# Patient Record
Sex: Female | Born: 1960 | Race: Black or African American | Hispanic: No | Marital: Single | Smoking: Current every day smoker
Health system: Southern US, Community
[De-identification: ages and names within clinical notes are randomized; demographics above are authoritative.]

---

## 2018-10-22 ENCOUNTER — Emergency Department
Admission: EM | Admit: 2018-10-22 | Discharge: 2018-10-22 | Disposition: A | Discharge status: Home / Self Care | Payer: Self-pay | Attending: Emergency Medicine | Admitting: Emergency Medicine

## 2018-10-22 ENCOUNTER — Other Ambulatory Visit: Payer: Self-pay

## 2018-10-22 ENCOUNTER — Encounter: Payer: Self-pay | Admitting: Emergency Medicine

## 2018-10-22 ENCOUNTER — Emergency Department: Payer: Self-pay

## 2018-10-22 DIAGNOSIS — F17210 Nicotine dependence, cigarettes, uncomplicated: Secondary | ICD-10-CM | POA: Insufficient documentation

## 2018-10-22 DIAGNOSIS — D179 Benign lipomatous neoplasm, unspecified: Secondary | ICD-10-CM | POA: Insufficient documentation

## 2018-10-22 DIAGNOSIS — M25532 Pain in left wrist: Secondary | ICD-10-CM | POA: Insufficient documentation

## 2018-10-22 MED ORDER — NAPROXEN 500 MG PO TABS: 500.0000 mg | ORAL_TABLET | Freq: Two times a day (BID) | ORAL | Status: DC

## 2018-10-22 NOTE — Discharge Instructions (Signed)
Follow discharge care instruction.  Wear splint for 3 to 4 days as needed.  Take medication as directed.

## 2018-10-22 NOTE — ED Triage Notes (Signed)
Pt here with c/o left hand pain, states she was in grocery store, saw a lady falling and reached out to help her, in the process, pt fell to floor, catching herself with left hand, no swelling or deformity noted, able to move hand and fingers.

## 2018-10-22 NOTE — ED Provider Notes (Signed)
[facility] Emergency Department Provider Note   ____________________________________________   First MD Initiated Contact with Patient 10/22/18 1006     (approximate)  I have reviewed the triage vital signs and the nursing notes.   HISTORY  Chief Complaint Hand Pain    HPI Carrie Jones is a 58 y.o. female patient complain of left hand and wrist pain for 2 days.  Patient states she was in a grocery store and saw a lady followed every child to help her.  Patient states she fell to the floor breaking her fall with her left outstretched hand.  Patient denies deformity or obvious swelling.  Patient states pain increased with hyperextension of the wrist.    History reviewed. No pertinent past medical history.  There are no active problems to display for this patient.   History reviewed. No pertinent surgical history.  Prior to Admission medications   Medication Sig Start Date End Date Taking? Authorizing Provider  naproxen (NAPROSYN) 500 MG tablet Take 1 tablet (500 mg total) by mouth 2 (two) times daily with a meal. 10/22/18   Sable Feil, PA-C    Allergies Patient has no known allergies.  No family history on file.  Social History Social History   Tobacco Use  . Smoking status: Current Every Day Smoker    Packs/day: 0.50  . Smokeless tobacco: Never Used  Substance Use Topics  . Alcohol use: Yes    Comment: occas.   . Drug use: Not on file    Review of Systems Constitutional: No fever/chills Eyes: No visual changes. ENT: No sore throat. Cardiovascular: Denies chest pain. Respiratory: Denies shortness of breath. Gastrointestinal: No abdominal pain.  No nausea, no vomiting.  No diarrhea.  No constipation. Genitourinary: Negative for dysuria. Musculoskeletal: Left wrist pain. Skin: Negative for rash.  Nodule lesion left wrist and right anterior shoulder. Neurological: Negative for headaches, focal weakness or  numbness.   ____________________________________________   PHYSICAL EXAM:  VITAL SIGNS: ED Triage Vitals [10/22/18 0938]  Enc Vitals Group     BP 132/78     Pulse Rate 76     Resp 18     Temp 98.3 F (36.8 C)     Temp Source Oral     SpO2 98 %     Weight 122 lb (55.3 kg)     Height 5' (1.524 m)     Head Circumference      Peak Flow      Pain Score 8     Pain Loc      Pain Edu?      Excl. in [place]?     Constitutional: Alert and oriented. Well appearing and in no acute distress. Cardiovascular: Normal rate, regular rhythm. Grossly normal heart sounds.  Good peripheral circulation. Respiratory: Normal respiratory effort.  No retractions. Lungs CTAB. Musculoskeletal: No obvious deformity to the left wrist.  Patient has decreased range of motion with extension limited by complaint of pain.  Neurologic:  Normal speech and language. No gross focal neurologic deficits are appreciated. No gait instability. Skin:  Skin is warm, dry and intact. No rash noted.  Nodule lesion dorsal aspect of left wrist and anterior right shoulder. Psychiatric: Mood and affect are normal. Speech and behavior are normal.  ____________________________________________   LABS (all labs ordered are listed, but only abnormal results are displayed)  Labs Reviewed - No data to display ____________________________________________  EKG   ____________________________________________  RADIOLOGY  ED MD interpretation:    Official  radiology report(s): Dg Wrist Complete Left  Result Date: 10/22/2018 CLINICAL DATA:  Left wrist pain centered about the thumb since the patient tried to catch another person a couple of days ago. Initial encounter. EXAM: LEFT WRIST - COMPLETE 3+ VIEW COMPARISON:  None. FINDINGS: There is no acute bony or joint abnormality. There is some joint space narrowing and subchondral cyst formation at the first [facility] and scaphoid trapezium joints. No erosion. Mineralization and alignment are  normal. No chondrocalcinosis. Soft tissues appear normal. IMPRESSION: No acute abnormality. 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 Electronically Signed   By: Inge Rise M.D.   On: 10/22/2018 10:47    ____________________________________________   PROCEDURES  Procedure(s) performed: None  Procedures  Critical Care performed: No  ____________________________________________   INITIAL IMPRESSION / ASSESSMENT AND PLAN / ED COURSE  As part of my medical decision making, I reviewed the following data within the electronic MEDICAL RECORD NUMBER     Left wrist pain secondary to sprain and DJD.  Patient also have 2 nodular lesion consistent with lipoma or or a cystic mass.  Patient placed in a volar wrist splint and given naproxen prescription.  Patient advised to follow-up with the surgical clinic for evaluation treatment of the nodule lesions mentioned above.      ____________________________________________   FINAL CLINICAL IMPRESSION(S) / ED DIAGNOSES  Final diagnoses:  Left wrist pain  Multiple lipomas     ED Discharge Orders         Ordered    naproxen (NAPROSYN) 500 MG tablet  2 times daily with meals     10/22/18 1053           Note:  This document was prepared using Dragon voice recognition software and may include unintentional dictation errors.    Sable Feil, PA-C 10/22/18 1119    Earleen Newport, MD 10/22/18 [phone]

## 2018-10-22 NOTE — ED Notes (Signed)
See triage note  Presents with pain to left hand   Pain is mainly near thumb and into wrist area   States she did not fall but tried to catch someone couple of days ago

## 2018-11-04 ENCOUNTER — Encounter: Payer: Self-pay | Admitting: *Deleted

## 2019-12-18 IMAGING — DX DG WRIST COMPLETE 3+V*L*
4 series · 4 of 4 positions shown · non-contrast
Comparison: None.

CLINICAL DATA: Left wrist pain centered about the thumb since the
patient tried to catch another person a couple of days ago. Initial
encounter.

EXAM:
LEFT WRIST - COMPLETE 3+ VIEW

[wrist obl]
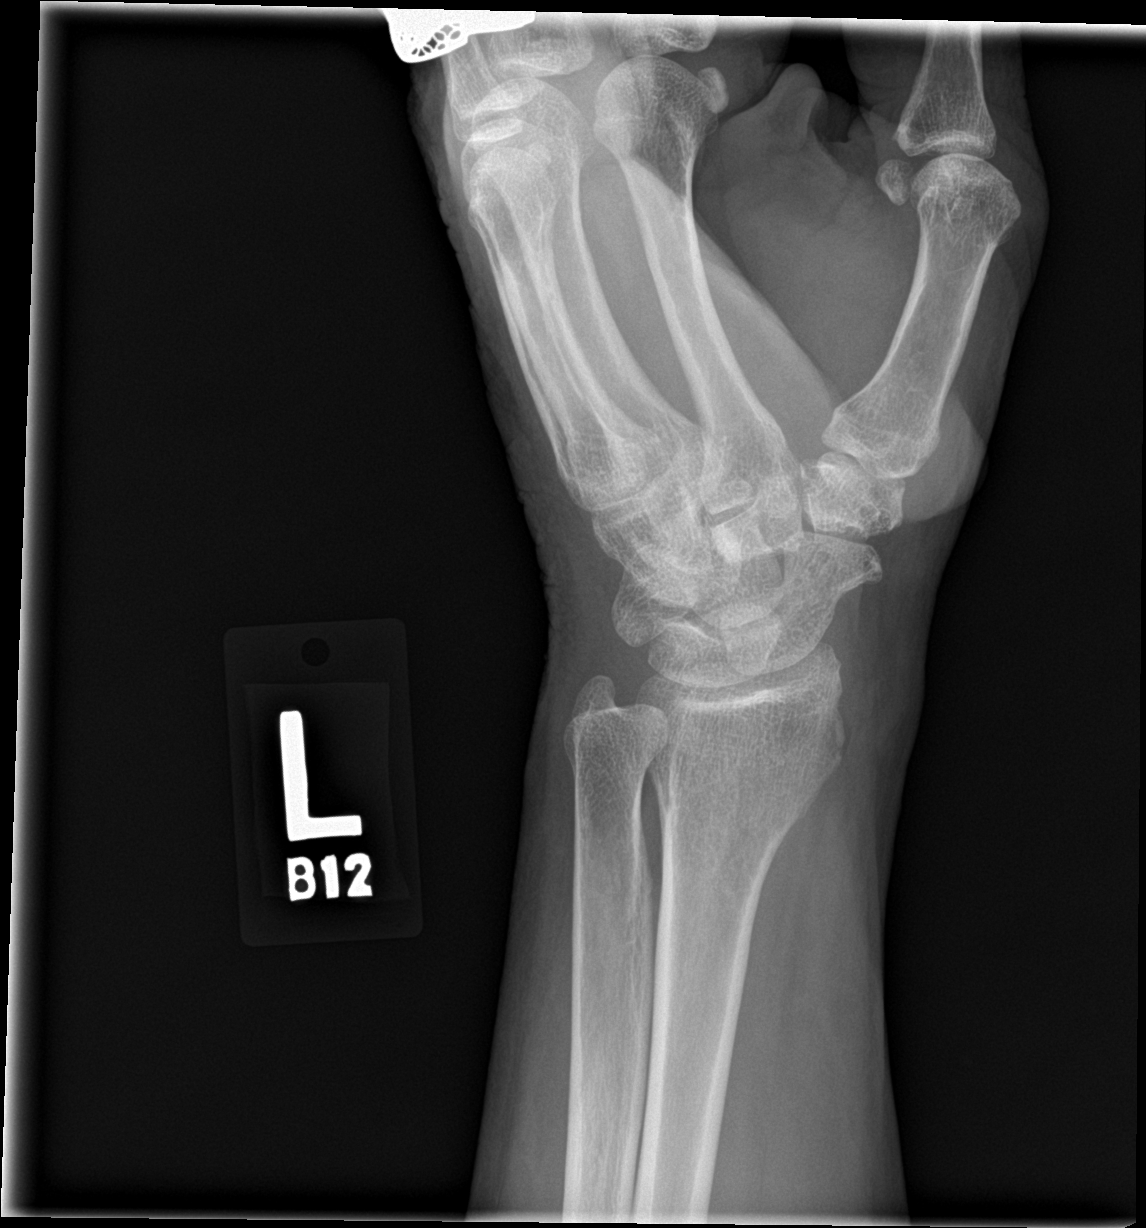

[wrist lat]
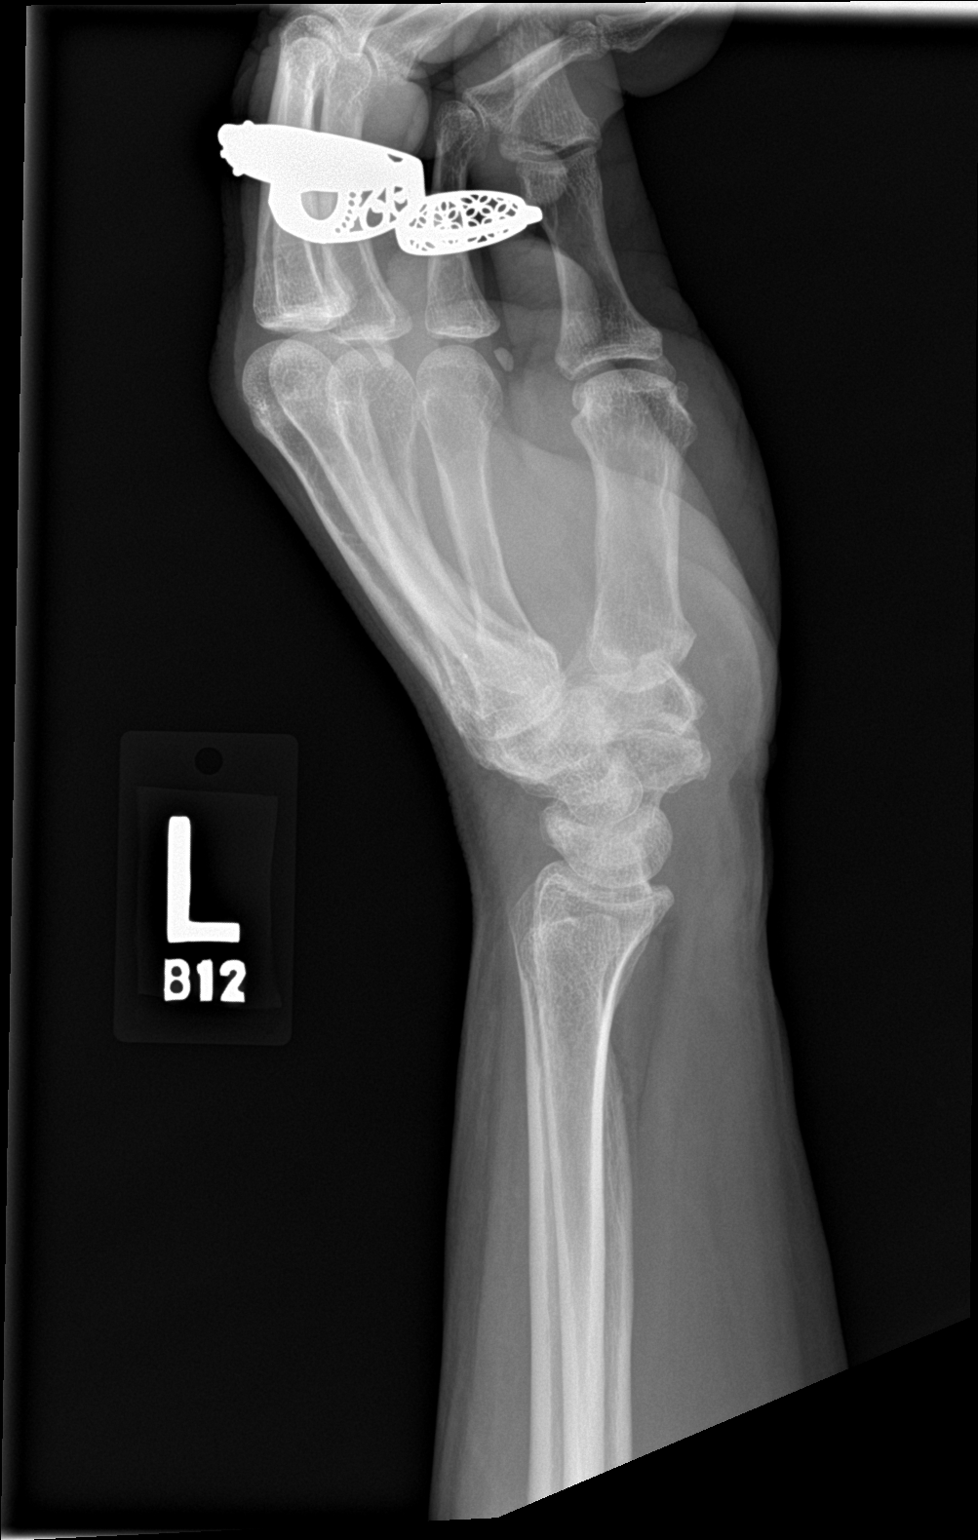

[wrist ap (1 of 2)]
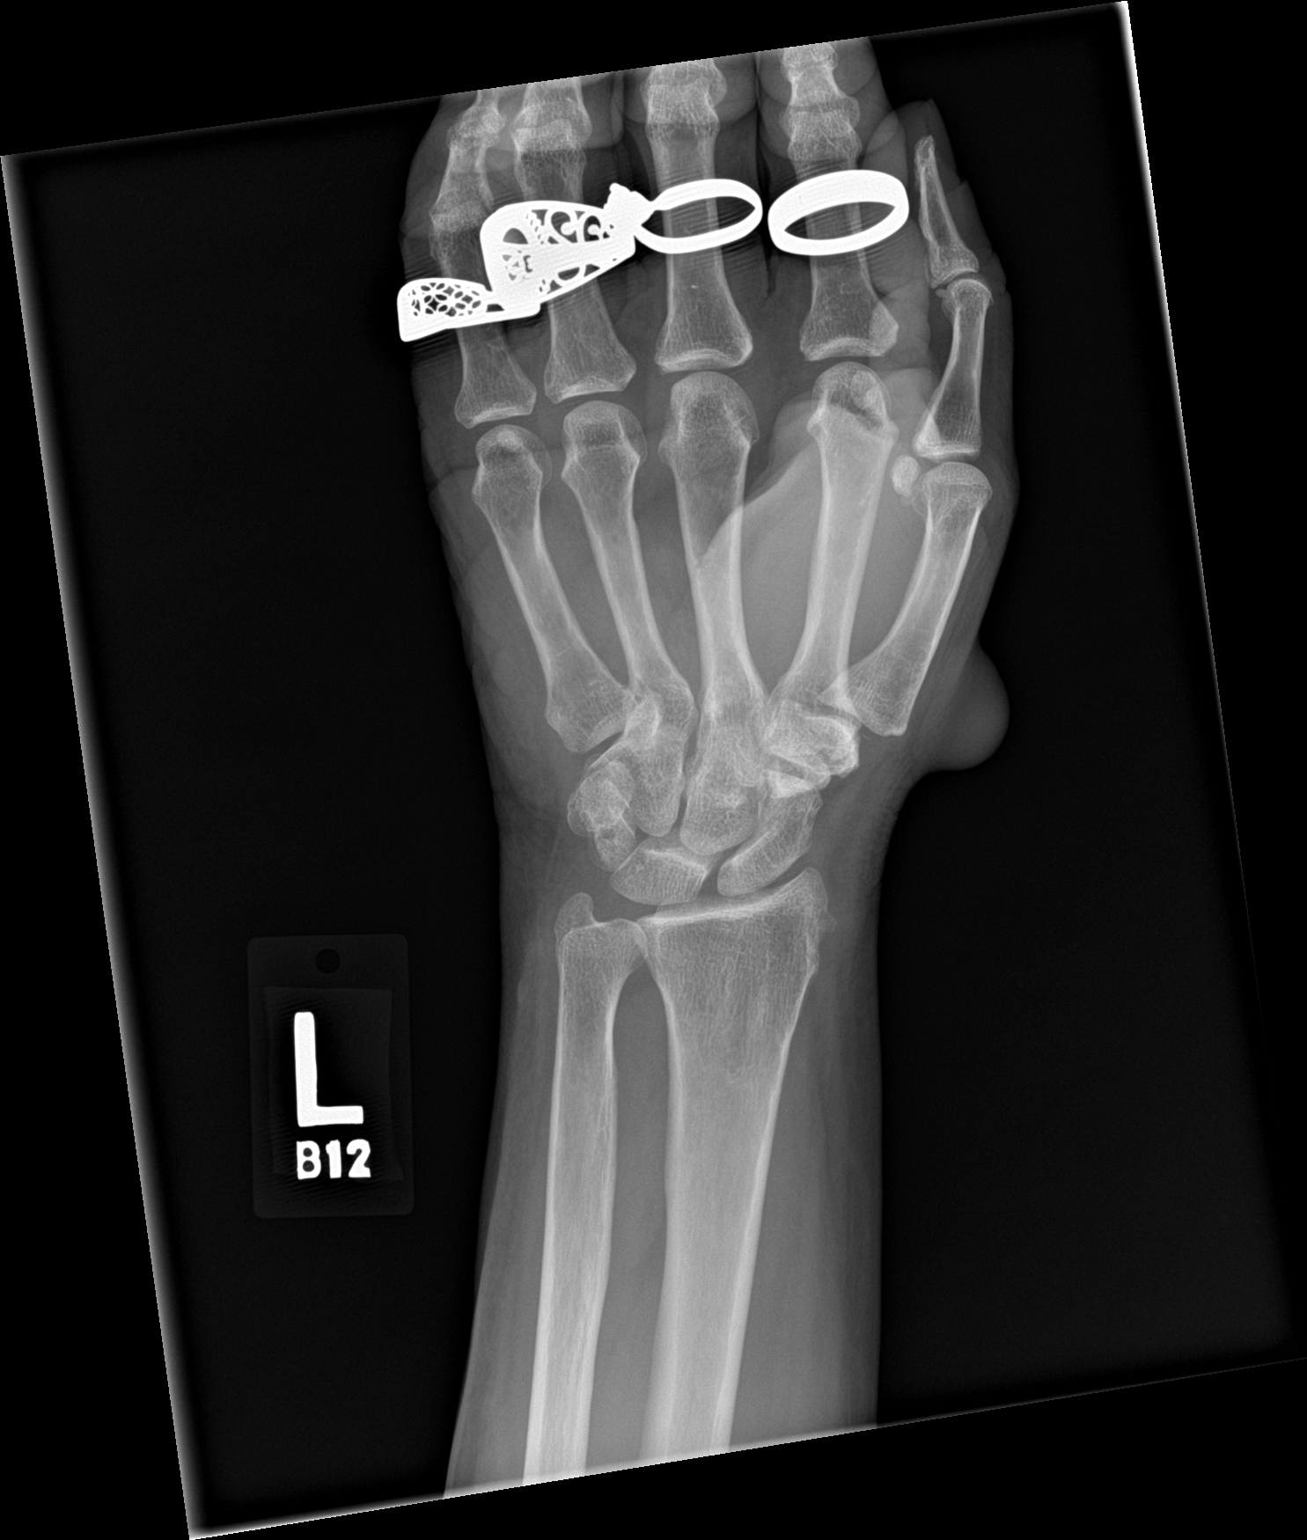

[wrist ap (2 of 2)]
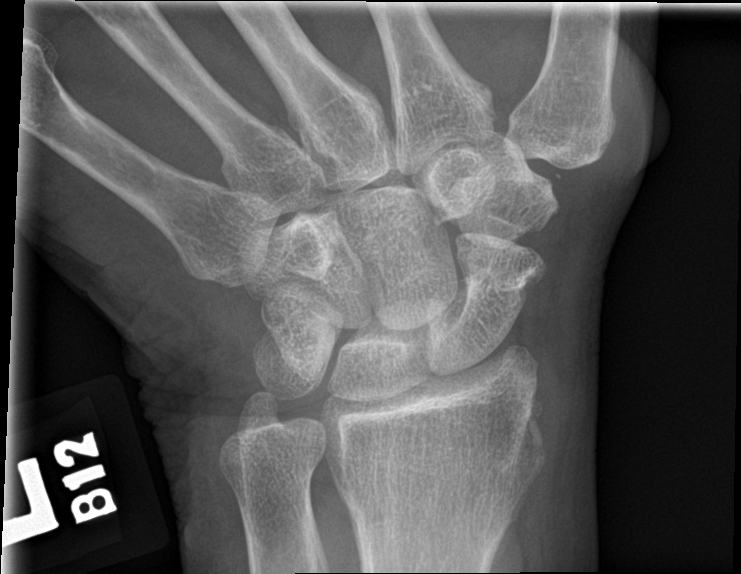

[4 of 4 positions shown; findings below may reference images not displayed]

FINDINGS: There is no acute bony or joint abnormality. There is some joint
space narrowing and subchondral cyst formation at the first CMC and
scaphoid trapezium joints. No erosion. Mineralization and alignment
are normal. No chondrocalcinosis. Soft tissues appear normal.
IMPRESSION: No acute abnormality.

Mild to moderate appearing first CMC and STT osteoarthritis.

## 2020-02-09 ENCOUNTER — Ambulatory Visit: Payer: Self-pay | Attending: Internal Medicine

## 2020-02-09 DIAGNOSIS — Z23 Encounter for immunization: Secondary | ICD-10-CM

## 2020-02-09 NOTE — Progress Notes (Signed)
Covid-19 Vaccination Clinic  Name:  Carrie Jones    MRN: [id number] DOB: 07/01/1961  02/09/2020  [name] was observed post Covid-19 immunization for 15 minutes without incident. She was provided with Vaccine Information Sheet and instruction to access the V-Safe system.   [name] was instructed to call 911 with any severe reactions post vaccine: Marland Kitchen Difficulty breathing  . Swelling of face and throat  . A fast heartbeat  . A bad rash all over body  . Dizziness and weakness   Immunizations Administered    Name Date Dose VIS Date Route   Pfizer COVID-19 Vaccine 02/09/2020  2:04 PM 0.3 mL 11/19/2018 Intramuscular   Manufacturer: [employer], [employer]   Lot: [id number]   NDC: [address]-4

## 2023-07-23 ENCOUNTER — Inpatient Hospital Stay
Admission: EM | Admit: 2023-07-23 | Discharge: 2023-07-26 | DRG: 597 | Disposition: A | Discharge status: Home / Self Care | Payer: 59 | Attending: Internal Medicine | Admitting: Internal Medicine

## 2023-07-23 ENCOUNTER — Emergency Department: Payer: Self-pay

## 2023-07-23 ENCOUNTER — Encounter: Payer: Self-pay | Admitting: Emergency Medicine

## 2023-07-23 ENCOUNTER — Other Ambulatory Visit: Payer: Self-pay

## 2023-07-23 DIAGNOSIS — Z801 Family history of malignant neoplasm of trachea, bronchus and lung: Secondary | ICD-10-CM

## 2023-07-23 DIAGNOSIS — I81 Portal vein thrombosis: Secondary | ICD-10-CM | POA: Diagnosis present

## 2023-07-23 DIAGNOSIS — C7951 Secondary malignant neoplasm of bone: Secondary | ICD-10-CM | POA: Diagnosis present

## 2023-07-23 DIAGNOSIS — N9089 Other specified noninflammatory disorders of vulva and perineum: Secondary | ICD-10-CM

## 2023-07-23 DIAGNOSIS — N179 Acute kidney failure, unspecified: Secondary | ICD-10-CM | POA: Diagnosis not present

## 2023-07-23 DIAGNOSIS — K7460 Unspecified cirrhosis of liver: Secondary | ICD-10-CM | POA: Diagnosis present

## 2023-07-23 DIAGNOSIS — D72828 Other elevated white blood cell count: Secondary | ICD-10-CM | POA: Diagnosis present

## 2023-07-23 DIAGNOSIS — C50912 Malignant neoplasm of unspecified site of left female breast: Principal | ICD-10-CM | POA: Diagnosis present

## 2023-07-23 DIAGNOSIS — M899 Disorder of bone, unspecified: Secondary | ICD-10-CM | POA: Diagnosis not present

## 2023-07-23 DIAGNOSIS — C799 Secondary malignant neoplasm of unspecified site: Principal | ICD-10-CM

## 2023-07-23 DIAGNOSIS — G8929 Other chronic pain: Secondary | ICD-10-CM | POA: Diagnosis present

## 2023-07-23 DIAGNOSIS — Z515 Encounter for palliative care: Secondary | ICD-10-CM | POA: Diagnosis not present

## 2023-07-23 DIAGNOSIS — C787 Secondary malignant neoplasm of liver and intrahepatic bile duct: Secondary | ICD-10-CM | POA: Diagnosis present

## 2023-07-23 DIAGNOSIS — Z66 Do not resuscitate: Secondary | ICD-10-CM | POA: Diagnosis present

## 2023-07-23 DIAGNOSIS — Z79899 Other long term (current) drug therapy: Secondary | ICD-10-CM | POA: Diagnosis not present

## 2023-07-23 DIAGNOSIS — Z7901 Long term (current) use of anticoagulants: Secondary | ICD-10-CM | POA: Diagnosis not present

## 2023-07-23 DIAGNOSIS — F1010 Alcohol abuse, uncomplicated: Secondary | ICD-10-CM | POA: Insufficient documentation

## 2023-07-23 DIAGNOSIS — Z789 Other specified health status: Secondary | ICD-10-CM | POA: Diagnosis not present

## 2023-07-23 DIAGNOSIS — Z720 Tobacco use: Secondary | ICD-10-CM | POA: Insufficient documentation

## 2023-07-23 DIAGNOSIS — F17210 Nicotine dependence, cigarettes, uncomplicated: Secondary | ICD-10-CM | POA: Diagnosis present

## 2023-07-23 DIAGNOSIS — A630 Anogenital (venereal) warts: Secondary | ICD-10-CM | POA: Diagnosis present

## 2023-07-23 DIAGNOSIS — R7401 Elevation of levels of liver transaminase levels: Secondary | ICD-10-CM

## 2023-07-23 DIAGNOSIS — Z803 Family history of malignant neoplasm of breast: Secondary | ICD-10-CM | POA: Diagnosis not present

## 2023-07-23 DIAGNOSIS — D630 Anemia in neoplastic disease: Secondary | ICD-10-CM | POA: Diagnosis present

## 2023-07-23 DIAGNOSIS — M549 Dorsalgia, unspecified: Secondary | ICD-10-CM | POA: Diagnosis present

## 2023-07-23 DIAGNOSIS — M546 Pain in thoracic spine: Secondary | ICD-10-CM

## 2023-07-23 DIAGNOSIS — C50919 Malignant neoplasm of unspecified site of unspecified female breast: Secondary | ICD-10-CM | POA: Diagnosis present

## 2023-07-23 LAB — CBC WITH DIFFERENTIAL/PLATELET
Abs Immature Granulocytes: 0.11 10*3/uL — ABNORMAL HIGH (ref 0.00–0.07)
Basophils Absolute: 0 10*3/uL (ref 0.0–0.1)
Basophils Relative: 0 %
Eosinophils Absolute: 0.1 10*3/uL (ref 0.0–0.5)
Eosinophils Relative: 1 %
HCT: 38.6 % (ref 36.0–46.0)
Hemoglobin: 12.4 g/dL (ref 12.0–15.0)
Immature Granulocytes: 1 %
Lymphocytes Relative: 13 %
Lymphs Abs: 1.7 10*3/uL (ref 0.7–4.0)
MCH: 29.2 pg (ref 26.0–34.0)
MCHC: 32.1 g/dL (ref 30.0–36.0)
MCV: 90.8 fL (ref 80.0–100.0)
Monocytes Absolute: 0.9 10*3/uL (ref 0.1–1.0)
Monocytes Relative: 7 %
Neutro Abs: 10.5 10*3/uL — ABNORMAL HIGH (ref 1.7–7.7)
Neutrophils Relative %: 78 %
Platelets: 157 10*3/uL (ref 150–400)
RBC: 4.25 MIL/uL (ref 3.87–5.11)
RDW: 18.7 % — ABNORMAL HIGH (ref 11.5–15.5)
WBC: 13.3 10*3/uL — ABNORMAL HIGH (ref 4.0–10.5)
nRBC: 2.7 % — ABNORMAL HIGH (ref 0.0–0.2)

## 2023-07-23 LAB — HIV ANTIBODY (ROUTINE TESTING W REFLEX): HIV Screen 4th Generation wRfx: NONREACTIVE

## 2023-07-23 LAB — COMPREHENSIVE METABOLIC PANEL
ALT: 57 U/L — ABNORMAL HIGH (ref 0–44)
AST: 300 U/L — ABNORMAL HIGH (ref 15–41)
Albumin: 2.6 g/dL — ABNORMAL LOW (ref 3.5–5.0)
Alkaline Phosphatase: 173 U/L — ABNORMAL HIGH (ref 38–126)
Anion gap: 13 (ref 5–15)
BUN: 17 mg/dL (ref 8–23)
CO2: 23 mmol/L (ref 22–32)
Calcium: 10 mg/dL (ref 8.9–10.3)
Chloride: 104 mmol/L (ref 98–111)
Creatinine, Ser: 0.53 mg/dL (ref 0.44–1.00)
GFR, Estimated: >60 mL/min (ref >60)
Glucose, Bld: 79 mg/dL (ref 70–99)
Potassium: 3.3 mmol/L — ABNORMAL LOW (ref 3.5–5.1)
Sodium: 140 mmol/L (ref 135–145)
Total Bilirubin: 5.6 mg/dL — ABNORMAL HIGH (ref 0.3–1.2)
Total Protein: 6.9 g/dL (ref 6.5–8.1)

## 2023-07-23 LAB — PROTIME-INR
INR: 1.2 (ref 0.8–1.2)
Prothrombin Time: 15.2 s (ref 11.4–15.2)

## 2023-07-23 LAB — LIPASE, BLOOD: Lipase: 19 U/L (ref 11–51)

## 2023-07-23 LAB — TROPONIN I (HIGH SENSITIVITY): Troponin I (High Sensitivity): 6 ng/L (ref <18)

## 2023-07-23 MED ORDER — SODIUM CHLORIDE 0.9 % IV BOLUS
1000.0000 mL | Freq: Once | INTRAVENOUS | Status: AC
  Administered 2023-07-23: 1000 mL via INTRAVENOUS

## 2023-07-23 MED ORDER — THIAMINE HCL 100 MG/ML IJ SOLN
100.0000 mg | Freq: Every day | INTRAMUSCULAR | Status: DC
  Filled 2023-07-23: qty 2

## 2023-07-23 MED ORDER — ADULT MULTIVITAMIN W/MINERALS CH
1.0000 | ORAL_TABLET | Freq: Every day | ORAL | Status: DC
  Administered 2023-07-23 – 2023-07-25 (×3): 1 via ORAL
  Filled 2023-07-23 (×4): qty 1

## 2023-07-23 MED ORDER — LORAZEPAM 1 MG PO TABS: 1.0000 mg | ORAL_TABLET | ORAL | Status: DC | PRN

## 2023-07-23 MED ORDER — IOHEXOL 350 MG/ML SOLN
100.0000 mL | Freq: Once | INTRAVENOUS | Status: AC | PRN
  Administered 2023-07-23: 100 mL via INTRAVENOUS

## 2023-07-23 MED ORDER — ENOXAPARIN SODIUM 40 MG/0.4ML IJ SOSY: 40.0000 mg | PREFILLED_SYRINGE | INTRAMUSCULAR | Status: DC

## 2023-07-23 MED ORDER — LORAZEPAM 0.5 MG PO TABS: 0.5000 mg | ORAL_TABLET | ORAL | Status: DC | PRN

## 2023-07-23 MED ORDER — ONDANSETRON HCL 4 MG PO TABS: 4.0000 mg | ORAL_TABLET | Freq: Four times a day (QID) | ORAL | Status: DC | PRN

## 2023-07-23 MED ORDER — ONDANSETRON HCL 4 MG/2ML IJ SOLN
4.0000 mg | Freq: Four times a day (QID) | INTRAMUSCULAR | Status: DC | PRN
  Administered 2023-07-23: 4 mg via INTRAVENOUS
  Filled 2023-07-23: qty 2

## 2023-07-23 MED ORDER — ENOXAPARIN SODIUM 60 MG/0.6ML IJ SOSY
1.0000 mg/kg | PREFILLED_SYRINGE | Freq: Two times a day (BID) | INTRAMUSCULAR | Status: DC
  Administered 2023-07-23 – 2023-07-26 (×5): 55 mg via SUBCUTANEOUS
  Filled 2023-07-23 (×5): qty 0.6

## 2023-07-23 MED ORDER — MORPHINE SULFATE (PF) 4 MG/ML IV SOLN
4.0000 mg | Freq: Once | INTRAVENOUS | Status: AC
  Administered 2023-07-23: 4 mg via INTRAVENOUS
  Filled 2023-07-23: qty 1

## 2023-07-23 MED ORDER — MORPHINE SULFATE (PF) 2 MG/ML IV SOLN
2.0000 mg | INTRAVENOUS | Status: DC | PRN
  Administered 2023-07-23 (×2): 2 mg via INTRAVENOUS
  Filled 2023-07-23 (×2): qty 1

## 2023-07-23 MED ORDER — THIAMINE MONONITRATE 100 MG PO TABS
100.0000 mg | ORAL_TABLET | Freq: Every day | ORAL | Status: DC
  Administered 2023-07-23 – 2023-07-25 (×3): 100 mg via ORAL
  Filled 2023-07-23 (×4): qty 1

## 2023-07-23 MED ORDER — FOLIC ACID 1 MG PO TABS
1.0000 mg | ORAL_TABLET | Freq: Every day | ORAL | Status: DC
  Administered 2023-07-23 – 2023-07-25 (×3): 1 mg via ORAL
  Filled 2023-07-23 (×4): qty 1

## 2023-07-23 NOTE — Assessment & Plan Note (Signed)
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

## 2023-07-23 NOTE — Consult Note (Signed)
PHARMACY - ANTICOAGULATION CONSULT NOTE  Pharmacy Consult for therapeutic enoxaparin Indication: Portal vein thrombosis   No Known Allergies  Patient Measurements: Height: 5' (152.4 cm) Weight: 55.3 kg (121 lb 14.6 oz) IBW/kg (Calculated) : 45.5   Vital Signs: Temp: 97.8 F (36.6 C) (10/28 0845) Temp Source: Axillary (10/28 0845) BP: 112/76 (10/28 1355) Pulse Rate: 105 (10/28 1355)  Labs: Recent Labs    07/23/23 0924  HGB 12.4  HCT 38.6  PLT 157  CREATININE 0.53  TROPONINIHS 6    Estimated Creatinine Clearance: 56.9 mL/min (by C-G formula based on SCr of 0.53 mg/dL).   Medical History: History reviewed. No pertinent past medical history.  Medications:  No home anticoagulants per pharmacist review  Assessment: 62 yo female presents to ED due to back pain and breast mass.  Imaging concerning for portal vein thrombosis.  Pharmacy consulted to start therapeutic enoxaparin.  Goal of Therapy:  Anti-Xa level 0.6-1 units/ml 4hrs after LMWH dose given Monitor platelets by anticoagulation protocol: Yes   Plan:  Start enoxaparin 1 mg/kg (55 mg) SubQ every 12 hours If plan is for long term use consider getting Anti-Xa level at steady state. CBC at least every 72 hours  Barrie Folk, PharmD 07/23/2023,2:40 PM

## 2023-07-23 NOTE — Progress Notes (Signed)
MEWS Progress Note  Patient Details Name: Carrie Jones MRN: [id number] DOB: Oct 31, 1960 Today's Date: 07/23/2023   MEWS Flowsheet Documentation:  Assess: MEWS Score Temp: 97.8 F (36.6 C) BP: 117/72 MAP (mmHg): 87 Pulse Rate: (!) 103 Resp: (!) 24 SpO2: 94 % O2 Device: Room Air Assess: MEWS Score MEWS Temp: 0 MEWS Systolic: 0 MEWS Pulse: 1 MEWS RR: 1 MEWS LOC: 0 MEWS Score: 2 MEWS Score Color: Yellow Assess: SIRS CRITERIA SIRS Temperature : 0 SIRS Respirations : 1 SIRS Pulse: 1 SIRS WBC: 1 SIRS Score Sum : 3 SIRS Temperature : 0 SIRS Pulse: 1 SIRS Respirations : 1 SIRS WBC: 1 SIRS Score Sum : 3 Assess: if the MEWS score is Yellow or Red Were vital signs accurate and taken at a resting state?: Yes Does the patient meet 2 or more of the SIRS criteria?: Yes Does the patient have a confirmed or suspected source of infection?: No MEWS guidelines implemented : Yes, yellow Treat MEWS Interventions: Considered administering scheduled or prn medications/treatments as ordered Take Vital Signs Increase Vital Sign Frequency : Yellow: Q2hr x1, continue Q4hrs until patient remains green for 12hrs Escalate MEWS: Escalate: Yellow: Discuss with charge nurse and consider notifying provider and/or RRT Notify: Charge Nurse/RN Name of Charge Nurse/RN Notified: Verl Bangs, rn Provider Notification Provider Name/Title: Doree Albee Date Provider Notified: 07/23/23 Time Provider Notified: [id number] Method of Notification: Page Notification Reason: Change in status Provider response: No new orders Date of Provider Response: 07/23/23 Time of Provider Response: [id number]      Laqueta Due 07/23/2023, 6:33 PM

## 2023-07-23 NOTE — Assessment & Plan Note (Signed)
Regular intake of 2-3+40 ounce beers at least every other day CIWA protocol Monitor

## 2023-07-23 NOTE — Assessment & Plan Note (Signed)
CT imaging concerning for portal vein thrombosis in the setting of metastatic breast cancer Will start on treatment dose Lovenox Follow-up oncology as well as palliative care recommendations

## 2023-07-23 NOTE — ED Triage Notes (Signed)
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

## 2023-07-23 NOTE — Assessment & Plan Note (Addendum)
Noticed breast mass without follow-up over multiple months with new onset back pain CT imaging concerning for primary breast cancer with significant metastasis to bone as well as the liver +large infiltrative left-sided breast mass with skin thickening and edema Lytic lesions noted on L1,  T3, T8, T9, T11, and T12. Per Dr. Larinda Buttery, case discussed with Dr. Orlie Dakin with oncology Follow-up recommendations Palliative care also consulted for recommendations Pain control

## 2023-07-23 NOTE — Assessment & Plan Note (Signed)
1/2 pack/day smoker

## 2023-07-23 NOTE — Assessment & Plan Note (Signed)
T. 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

## 2023-07-23 NOTE — H&P (Signed)
History and Physical    Patient: Carrie Jones ZOX:[id number] DOB: 02-May-1961 DOA: 07/23/2023 DOS: the patient was seen and examined on 07/23/2023 PCP: Patient, No Pcp Per  Patient coming from: Home  Chief Complaint:  Chief Complaint  Patient presents with   Back Pain   HPI: Carrie Jones is a 62 y.o. female with medical history significant of alcohol and tobacco use presenting with back pain and metastatic breast cancer.  Patient reports noticing large lump on her left breast roughly 5 to 6 months ago.  Patient reports extensive prior family history of cancer including lung and the breast.  States she did not follow-up because she was afraid of what it might be.  Baseline 1/2 pack/day smoker.  Also drinks roughly 2 to 340 ounce beers every other day at minimum.  Unclear about illicit drug use.  Positive weight loss over multiple months.  Has had worsening back pain especially with movement over the past 1 to 2 weeks.  No fevers or chills.  No nausea or vomiting.  No shortness of breath.  Minimal chest pain.  Has not had PCP follow-up. Presented to the ER afebrile, heart rate 100s, BP stable.  Satting well on room air.  White count 13.3, hemoglobin 12.4, platelets 157, creatinine 0.53, AST 300, ALT 57, T. bili 5.6.  Alk phos 173.  CT angio of the chest negative for PE but does show a large infiltrative left-sided breast mass with skin thickening and edema.  Extensive soft tissue in the axilla which could represent abnormal nodes.  Extensive abnormal lymph nodes in the chest and abdomen as well as lung.  Liver lesions concerning for metastatic disease.  Positive portal vein thrombosis.  Some concern for cirrhosis with a Pado megaly.  Positive bony lytic lesions.  2 cm mass along the left vulva concerning for condyloma.  CT of the T and L-spine showing lytic lesions at L1, T3, T8, T9, T11, T12.  Per Dr. Larinda Buttery, case discussed with Dr. Orlie Dakin who will formally consult on patient.  Palliative care  also recommended. Review of Systems: As mentioned in the history of present illness. All other systems reviewed and are negative. History reviewed. No pertinent past medical history. History reviewed. No pertinent surgical history. Social History:  reports that she has been smoking cigarettes. She has never used smokeless tobacco. She reports current alcohol use. She reports that she does not use drugs.  No Known Allergies  History reviewed. No pertinent family history.  Prior to Admission medications   Not on File    Physical Exam: Vitals:   07/23/23 0837 07/23/23 0840 07/23/23 0845 07/23/23 1355  BP:  127/85 127/85 112/76  Pulse:  (!) 102 (!) 102 (!) 105  Resp:  17 17 17   Temp:  97.8 F (36.6 C) 97.8 F (36.6 C)   TempSrc:  Axillary Axillary   SpO2:  96% 96% 95%  Weight: 55.3 kg     Height: 5' (1.524 m)      Physical Exam Constitutional:      Appearance: She is normal weight.  HENT:     Head: Normocephalic and atraumatic.     Nose: Nose normal.     Mouth/Throat:     Mouth: Mucous membranes are dry.  Eyes:     Pupils: Pupils are equal, round, and reactive to light.  Cardiovascular:     Rate and Rhythm: Normal rate and regular rhythm.  Pulmonary:     Effort: Pulmonary effort is normal.  Abdominal:  General: Bowel sounds are normal.  Musculoskeletal:        General: Normal range of motion.  Skin:    General: Skin is dry.     Comments: + large hardened lesion on L breast   Neurological:     General: No focal deficit present.  Psychiatric:        Mood and Affect: Mood normal.     Data Reviewed:  There are no new results to review at this time.  CT L-SPINE NO CHARGE CLINICAL DATA:  Mid back pain, pain to left lateral chest wall  EXAM: CT THORACIC AND LUMBAR SPINE WITHOUT CONTRAST  TECHNIQUE: Multidetector CT imaging of the thoracic and lumbar spine was performed without contrast. Multiplanar CT image reconstructions were also generated.  RADIATION  DOSE REDUCTION: This exam was performed according to the departmental dose-optimization program which includes automated exposure control, adjustment of the mA and/or kV according to patient size and/or use of iterative reconstruction technique.  COMPARISON:  None Available.  FINDINGS: CT THORACIC SPINE FINDINGS  Alignment: No listhesis. Preservation of the normal thoracic kyphosis. Levocurvature of the upper to midthoracic spine and dextrocurvature of the thoracolumbar junction.  Vertebrae: Lytic lesions are suspected in the right aspect of T3 (series 3, image 22), the right aspect of T8 and T9 (series 3, image 67 and 79), and the left aspects of T11 and T12 (series 3, images 98 and 112); these lesions are quite subtle on CT, with the exception of T12 lesion. Vertebral body heights are preserved. No evidence of acute fracture.  Paraspinal and other soft tissues: Please see same-day CT chest.  Disc levels: Mild degenerative changes, without significant spinal canal stenosis.  CT LUMBAR SPINE FINDINGS  Segmentation: 5 lumbar type vertebral bodies.  Alignment: Mild levocurvature.  No significant listhesis.  Vertebrae: Lytic lesion in the left aspect of L1, with expansile soft tissue component (series 6, image 18). There is likely epidural extension along the right aspect of the tumor (series 6, image 17). No other suspected lesions in the lumbar spine. Vertebral body heights are preserved. No acute fracture.  Paraspinal and other soft tissues: Please see same-day CT abdomen pelvis.  Disc levels: Disc heights are preserved. No significant spinal canal stenosis or neural foraminal narrowing.  IMPRESSION: 1. Lytic lesion in the left aspect of L1, with expansile soft tissue component and likely epidural extension along the right aspect of the tumor. Additional lytic lesions are suspected in T3, T8, T9, T11, and T12. MRI of the thoracic and lumbar spine with and  without contrast is recommended for further evaluation. 2. No acute fracture or traumatic listhesis in the thoracic or lumbar spine. 3. For soft tissue findings, please see same-day CT chest abdomen pelvis.  Electronically Signed   By: Wiliam Ke M.D.   On: 07/23/2023 12:53 CT T-SPINE NO CHARGE CLINICAL DATA:  Mid back pain, pain to left lateral chest wall  EXAM: CT THORACIC AND LUMBAR SPINE WITHOUT CONTRAST  TECHNIQUE: Multidetector CT imaging of the thoracic and lumbar spine was performed without contrast. Multiplanar CT image reconstructions were also generated.  RADIATION DOSE REDUCTION: This exam was performed according to the departmental dose-optimization program which includes automated exposure control, adjustment of the mA and/or kV according to patient size and/or use of iterative reconstruction technique.  COMPARISON:  None Available.  FINDINGS: CT THORACIC SPINE FINDINGS  Alignment: No listhesis. Preservation of the normal thoracic kyphosis. Levocurvature of the upper to midthoracic spine and dextrocurvature of the thoracolumbar junction.  Vertebrae: Lytic lesions are suspected in the right aspect of T3 (series 3, image 22), the right aspect of T8 and T9 (series 3, image 67 and 79), and the left aspects of T11 and T12 (series 3, images 98 and 112); these lesions are quite subtle on CT, with the exception of T12 lesion. Vertebral body heights are preserved. No evidence of acute fracture.  Paraspinal and other soft tissues: Please see same-day CT chest.  Disc levels: Mild degenerative changes, without significant spinal canal stenosis.  CT LUMBAR SPINE FINDINGS  Segmentation: 5 lumbar type vertebral bodies.  Alignment: Mild levocurvature.  No significant listhesis.  Vertebrae: Lytic lesion in the left aspect of L1, with expansile soft tissue component (series 6, image 18). There is likely epidural extension along the right aspect of the tumor  (series 6, image 17). No other suspected lesions in the lumbar spine. Vertebral body heights are preserved. No acute fracture.  Paraspinal and other soft tissues: Please see same-day CT abdomen pelvis.  Disc levels: Disc heights are preserved. No significant spinal canal stenosis or neural foraminal narrowing.  IMPRESSION: 1. Lytic lesion in the left aspect of L1, with expansile soft tissue component and likely epidural extension along the right aspect of the tumor. Additional lytic lesions are suspected in T3, T8, T9, T11, and T12. MRI of the thoracic and lumbar spine with and without contrast is recommended for further evaluation. 2. No acute fracture or traumatic listhesis in the thoracic or lumbar spine. 3. For soft tissue findings, please see same-day CT chest abdomen pelvis.  Electronically Signed   By: Wiliam Ke M.D.   On: 07/23/2023 12:53 CT ABDOMEN PELVIS W CONTRAST CLINICAL DATA:  Mid back pain that began 2 weeks ago. * Tracking Code: BO *  EXAM: CT ANGIOGRAPHY CHEST  CT ABDOMEN AND PELVIS WITH CONTRAST  TECHNIQUE: Multidetector CT imaging of the chest was performed using the standard protocol during bolus administration of intravenous contrast. Multiplanar CT image reconstructions and MIPs were obtained to evaluate the vascular anatomy. Multidetector CT imaging of the abdomen and pelvis was performed using the standard protocol during bolus administration of intravenous contrast.  RADIATION DOSE REDUCTION: This exam was performed according to the departmental dose-optimization program which includes automated exposure control, adjustment of the mA and/or kV according to patient size and/or use of iterative reconstruction technique.  CONTRAST:  OMNIPAQUE IOHEXOL 350 MG/ML SOLN  COMPARISON:  None Available.  FINDINGS: CTA CHEST FINDINGS  Cardiovascular: Heart is slightly enlarged. No significant pericardial effusion. Coronary artery  calcifications are seen. The thoracic aorta has a normal course and caliber with some mild atherosclerotic plaque. There is some enlargement of the main pulmonary arteries. Please correlate for pulmonary artery hypertension. No segmental or larger pulmonary embolism identified. There is some breathing motion which can limit evaluation of small and peripheral emboli.  Mediastinum/Nodes: Normal caliber thoracic esophagus. Preserved thyroid gland. There is no specific abnormal lymph node enlargement identified in the right axillary region. There are some small bilateral hilar nodes. Example left left hilum on series 2, image 63 measures 7 mm in short axis. Right hilar node on series 2, image 55 has short axis measurement of 8 mm. There is 1 truly enlarged left hilar node on series 2, image 58 measuring 2.2 1.5 cm. There are some small mediastinal nodes as well. Example subcarinal node has short axis on series 2, image 58 measuring 13 mm. There is fullness in the AP window with a thickness approaching 10 mm  on series 2, image 43. Small nodes prevascular. There is also an abnormal left internal mammary chain node measuring 10 x 9 mm on series 2, image 36.  There is extensive abnormal tissue, presumed nodes in the left axillary region with a left-sided large breast mass on series 2, image 75, asymmetric from the right side. Please correlate with clinical history. There is associated skin thickening and anasarca.  Lungs/Pleura: No consolidation, pneumothorax. Trace pleural fluid. Basilar areas of bandlike opacity with ground-glass. Underlying centrilobular emphysematous changes diffusely. There is also presence of a left upper lobe nodule on series 4 image 48 measuring 10 by 9 mm. Additional small nodule left lower lobe on series 4, image 98. small amount of presumed debris along the trachea.  Musculoskeletal: Please see separate dictation of spine CT examinations. There are some lytic  lesions identified such as the right eighth rib on series 4, image 112. Few other areas suggested as well.  Review of the MIP images confirms the above findings.  CT ABDOMEN and PELVIS FINDINGS  Hepatobiliary: There are numerous small low-attenuation lesions scattered throughout the liver worrisome for metastatic disease. There is a large geographic areas of low-density in the right hepatic lobe consistent with fatty infiltration. In this area there appears to be areas of portal vein branch thrombosis. Changes of potential pseudo cirrhosis. Gallbladder is nondilated.  Pancreas: Unremarkable. No pancreatic ductal dilatation or surrounding inflammatory changes.  Spleen: Normal in size without focal abnormality.  Adrenals/Urinary Tract: Adrenal glands are preserved. No enhancing renal mass or collecting system dilatation. Preserved contours of the urinary bladder.  Stomach/Bowel: No oral contrast. The stomach is decompressed. The wall thickening along the pylorus could be related to level of distention. The small bowel is nondilated. Large bowel has a normal course and caliber with scattered stool and diverticulosis. Normal appendix in the right lower quadrant inferior to the cecum.  Vascular/Lymphatic: Scattered vascular calcifications. Normal caliber aorta and IVC. There are several abnormal nodes identified in the upper abdomen. Example portacaval series 3, image 27 measures 2.3 1.4 cm. Gastrohepatic ligament series 3, image 22 measures 17 by 15 mm. Several in the porta hepatis, some Peri aortic nodes as well.  Reproductive: Multiple calcified uterine fibroids. No separate adnexal mass. There is some nodular tissue in the area of the perineum on the left side along the vulva such as best seen coronal series 6, image 49 measuring 2.1 cm. Please correlate with direct visualization.  Other: Diffuse ascites.  Anasarca.  Musculoskeletal: There is some faint lytic lesions along  the spine and pelvis. Please see separate spinal imaging. Spine lesions include L1. Trace anterolisthesis of L5 on S1.  Review of the MIP images confirms the above findings.  Critical Value/emergent results were called by telephone at the time of interpretation on 07/23/2023 at 9:26 am to provider [facility] , who verbally acknowledged these results.  IMPRESSION: Large infiltrative left-sided breast mass with skin thickening and edema. Extensive soft tissue in the axilla which could represent abnormal nodes and direct spread.  In addition there are several abnormal lymph nodes in the chest and abdomen including lung hilum, mediastinum, internal mammary chain, supraclavicular, retroperitoneum, gastrohepatic ligament, porta hepatis.  Liver has a extensive low-attenuation lesions consistent with metastatic disease. In addition there is large geographic areas of fatty infiltration involving multiple segments with what appears to be areas of portal vein thrombosis in these locations. 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  Few lytic bone lesions identified along  the spine and ribs.  Separate subtle 2 cm proximally mass along the left side of the vulva. Please correlate with direct visualization.  Colonic diverticulosis.  No bowel obstruction or free air.  Trace pleural fluid.  No segmental or larger pulmonary embolism.  Electronically Signed   By: Karen Kays M.D.   On: 07/23/2023 12:29 CT Angio Chest PE W/Cm &/Or Wo Cm CLINICAL DATA:  Mid back pain that began 2 weeks ago. * Tracking Code: BO *  EXAM: CT ANGIOGRAPHY CHEST  CT ABDOMEN AND PELVIS WITH CONTRAST  TECHNIQUE: Multidetector CT imaging of the chest was performed using the standard protocol during bolus administration of intravenous contrast. Multiplanar CT image reconstructions and MIPs were obtained to evaluate the vascular anatomy. Multidetector CT imaging of the  abdomen and pelvis was performed using the standard protocol during bolus administration of intravenous contrast.  RADIATION DOSE REDUCTION: This exam was performed according to the departmental dose-optimization program which includes automated exposure control, adjustment of the mA and/or kV according to patient size and/or use of iterative reconstruction technique.  CONTRAST:  OMNIPAQUE IOHEXOL 350 MG/ML SOLN  COMPARISON:  None Available.  FINDINGS: CTA CHEST FINDINGS  Cardiovascular: Heart is slightly enlarged. No significant pericardial effusion. Coronary artery calcifications are seen. The thoracic aorta has a normal course and caliber with some mild atherosclerotic plaque. There is some enlargement of the main pulmonary arteries. Please correlate for pulmonary artery hypertension. No segmental or larger pulmonary embolism identified. There is some breathing motion which can limit evaluation of small and peripheral emboli.  Mediastinum/Nodes: Normal caliber thoracic esophagus. Preserved thyroid gland. There is no specific abnormal lymph node enlargement identified in the right axillary region. There are some small bilateral hilar nodes. Example left left hilum on series 2, image 63 measures 7 mm in short axis. Right hilar node on series 2, image 55 has short axis measurement of 8 mm. There is 1 truly enlarged left hilar node on series 2, image 58 measuring 2.2 1.5 cm. There are some small mediastinal nodes as well. Example subcarinal node has short axis on series 2, image 58 measuring 13 mm. There is fullness in the AP window with a thickness approaching 10 mm on series 2, image 43. Small nodes prevascular. There is also an abnormal left internal mammary chain node measuring 10 x 9 mm on series 2, image 36.  There is extensive abnormal tissue, presumed nodes in the left axillary region with a left-sided large breast mass on series 2, image 75, asymmetric from the  right side. Please correlate with clinical history. There is associated skin thickening and anasarca.  Lungs/Pleura: No consolidation, pneumothorax. Trace pleural fluid. Basilar areas of bandlike opacity with ground-glass. Underlying centrilobular emphysematous changes diffusely. There is also presence of a left upper lobe nodule on series 4 image 48 measuring 10 by 9 mm. Additional small nodule left lower lobe on series 4, image 98. small amount of presumed debris along the trachea.  Musculoskeletal: Please see separate dictation of spine CT examinations. There are some lytic lesions identified such as the right eighth rib on series 4, image 112. Few other areas suggested as well.  Review of the MIP images confirms the above findings.  CT ABDOMEN and PELVIS FINDINGS  Hepatobiliary: There are numerous small low-attenuation lesions scattered throughout the liver worrisome for metastatic disease. There is a large geographic areas of low-density in the right hepatic lobe consistent with fatty infiltration. In this area there appears to be areas of  portal vein branch thrombosis. Changes of potential pseudo cirrhosis. Gallbladder is nondilated.  Pancreas: Unremarkable. No pancreatic ductal dilatation or surrounding inflammatory changes.  Spleen: Normal in size without focal abnormality.  Adrenals/Urinary Tract: Adrenal glands are preserved. No enhancing renal mass or collecting system dilatation. Preserved contours of the urinary bladder.  Stomach/Bowel: No oral contrast. The stomach is decompressed. The wall thickening along the pylorus could be related to level of distention. The small bowel is nondilated. Large bowel has a normal course and caliber with scattered stool and diverticulosis. Normal appendix in the right lower quadrant inferior to the cecum.  Vascular/Lymphatic: Scattered vascular calcifications. Normal caliber aorta and IVC. There are several abnormal nodes  identified in the upper abdomen. Example portacaval series 3, image 27 measures 2.3 1.4 cm. Gastrohepatic ligament series 3, image 22 measures 17 by 15 mm. Several in the porta hepatis, some Peri aortic nodes as well.  Reproductive: Multiple calcified uterine fibroids. No separate adnexal mass. There is some nodular tissue in the area of the perineum on the left side along the vulva such as best seen coronal series 6, image 49 measuring 2.1 cm. Please correlate with direct visualization.  Other: Diffuse ascites.  Anasarca.  Musculoskeletal: There is some faint lytic lesions along the spine and pelvis. Please see separate spinal imaging. Spine lesions include L1. Trace anterolisthesis of L5 on S1.  Review of the MIP images confirms the above findings.  Critical Value/emergent results were called by telephone at the time of interpretation on 07/23/2023 at 9:26 am to provider [facility] , who verbally acknowledged these results.  IMPRESSION: Large infiltrative left-sided breast mass with skin thickening and edema. Extensive soft tissue in the axilla which could represent abnormal nodes and direct spread.  In addition there are several abnormal lymph nodes in the chest and abdomen including lung hilum, mediastinum, internal mammary chain, supraclavicular, retroperitoneum, gastrohepatic ligament, porta hepatis.  Liver has a extensive low-attenuation lesions consistent with metastatic disease. In addition there is large geographic areas of fatty infiltration involving multiple segments with what appears to be areas of portal vein thrombosis in these locations. 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  Few lytic bone lesions identified along the spine and ribs.  Separate subtle 2 cm proximally mass along the left side of the vulva. Please correlate with direct visualization.  Colonic diverticulosis.  No bowel obstruction or free  air.  Trace pleural fluid.  No segmental or larger pulmonary embolism.  Electronically Signed   By: Karen Kays M.D.   On: 07/23/2023 12:29  Lab Results  Component Value Date   WBC 13.3 (H) 07/23/2023   HGB 12.4 07/23/2023   HCT 38.6 07/23/2023   MCV 90.8 07/23/2023   PLT 157 07/23/2023   Last metabolic panel Lab Results  Component Value Date   GLUCOSE 79 07/23/2023   NA 140 07/23/2023   K 3.3 (L) 07/23/2023   CL 104 07/23/2023   CO2 23 07/23/2023   BUN 17 07/23/2023   CREATININE 0.53 07/23/2023   GFRNONAA >60 07/23/2023   CALCIUM 10.0 07/23/2023   PROT 6.9 07/23/2023   ALBUMIN 2.6 (L) 07/23/2023   BILITOT 5.6 (H) 07/23/2023   ALKPHOS 173 (H) 07/23/2023   AST 300 (H) 07/23/2023   ALT 57 (H) 07/23/2023   ANIONGAP 13 07/23/2023    Assessment and Plan: * Breast cancer metastasized to bone (HCC) Noticed breast mass without follow-up over multiple months with new onset back pain CT imaging concerning for  primary breast cancer with significant metastasis to bone as well as the liver +large infiltrative left-sided breast mass with skin thickening and edema Lytic lesions noted on L1,  T3, T8, T9, T11, and T12. Per Dr. Larinda Buttery, case discussed with Dr. Orlie Dakin with oncology Follow-up recommendations Palliative care also consulted for recommendations Pain control   Hyperbilirubinemia T. 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 Monitor GYN consult as clinic indicated  Tobacco abuse 1/2 pack/day smoker  Alcohol abuse Regular intake of 2-3+40 ounce beers at least every other day CIWA protocol Monitor  Portal vein thrombosis CT imaging concerning for portal vein thrombosis in the setting of metastatic breast cancer Will start on treatment dose Lovenox Follow-up oncology as well  as palliative care recommendations      Advance Care Planning:   Code Status: Limited: Do not attempt resuscitation (DNR) -DNR-LIMITED -Do Not Intubate/DNI    Consults: Oncology, Palliative Care   Family Communication: No family at the bedside   Severity of Illness: The appropriate patient status for this patient is OBSERVATION. Observation status is judged to be reasonable and necessary in order to provide the required intensity of service to ensure the patient's safety. The patient's presenting symptoms, physical exam findings, and initial radiographic and laboratory data in the context of their medical condition is felt to place them at decreased risk for further clinical deterioration. Furthermore, it is anticipated that the patient will be medically stable for discharge from the hospital within 2 midnights of admission.   Author: Floydene Flock, MD 07/23/2023 2:42 PM  For on call review [email or web address].

## 2023-07-23 NOTE — ED Provider Notes (Signed)
[facility] Provider Note    Event Date/Time   First MD Initiated Contact with Patient 07/23/23 [phone]     (approximate)   History   Chief Complaint Back Pain   HPI  Carrie Jones is a 62 y.o. female with no significant past medical history who presents to the ED complaining of back pain.  Patient reports that she has had 2 weeks of increasing pain along the left side of her chest as well as in the middle of her upper back.  Pain is worse with certain movements and she denies any fevers, cough, or difficulty breathing.  She does report sharp pain when she takes a deep breath and has noticed a small amount of swelling in both of her legs, denies any pain in either leg.  She also reports an enlarging mass in her left breast for the past couple of months which has been very firm to touch.  She has not noticed any overlying redness and the mass is not particularly tender, also denies any drainage from her nipple on this side.  She has noted some distention of her abdomen but denies significant pain in her abdomen.  She is concerned that she could have breast cancer, has never seen a physician for this previously.     Physical Exam   Triage Vital Signs: ED Triage Vitals  Encounter Vitals Group     BP 07/23/23 0840 127/85     Systolic BP Percentile --      Diastolic BP Percentile --      Pulse Rate 07/23/23 0840 (!) 102     Resp 07/23/23 0840 17     Temp 07/23/23 0840 97.8 F (36.6 C)     Temp Source 07/23/23 0840 Axillary     SpO2 07/23/23 0840 96 %     Weight 07/23/23 0837 121 lb 14.6 oz (55.3 kg)     Height 07/23/23 0837 5' (1.524 m)     Head Circumference --      Peak Flow --      Pain Score --      Pain Loc --      Pain Education --      Exclude from Growth Chart --     Most recent vital signs: Vitals:   07/23/23 0840 07/23/23 0845  BP: 127/85 127/85  Pulse: (!) 102 (!) 102  Resp: 17 17  Temp: 97.8 F (36.6 C) 97.8 F (36.6 C)  SpO2: 96%  96%    Constitutional: Alert and oriented. Eyes: Conjunctivae are normal. Head: Atraumatic. Nose: No congestion/rhinnorhea. Mouth/Throat: Mucous membranes are moist.  Cardiovascular: Normal rate, regular rhythm. Grossly normal heart sounds.  2+ radial pulses bilaterally. Respiratory: Normal respiratory effort.  No retractions. Lungs CTAB.  Large and firm mass that is immobile to the left breast, no overlying erythema, warmth, or tenderness.  Tenderness over left lateral chest wall as well as midline thoracic spine. Gastrointestinal: Soft and diffusely tender to palpation, particularly around enlarged feeling liver.  Diffuse distention noted. Musculoskeletal: No lower extremity tenderness nor edema.  Neurologic:  Normal speech and language. No gross focal neurologic deficits are appreciated.    ED Results / Procedures / Treatments   Labs (all labs ordered are listed, but only abnormal results are displayed) Labs Reviewed  CBC WITH DIFFERENTIAL/PLATELET - Abnormal; Notable for the following components:      Result Value   WBC 13.3 (*)    RDW 18.7 (*)    nRBC 2.7 (*)  Neutro Abs 10.5 (*)    Abs Immature Granulocytes 0.11 (*)    All other components within normal limits  COMPREHENSIVE METABOLIC PANEL - Abnormal; Notable for the following components:   Potassium 3.3 (*)    Albumin 2.6 (*)    AST 300 (*)    ALT 57 (*)    Alkaline Phosphatase 173 (*)    Total Bilirubin 5.6 (*)    All other components within normal limits  LIPASE, BLOOD  TROPONIN I (HIGH SENSITIVITY)     EKG  ED ECG REPORT I, Chesley Noon, the attending physician, personally viewed and interpreted this ECG.   Date: 07/23/2023  EKG Time: 9:26  Rate: 97  Rhythm: normal sinus rhythm  Axis: Normal  Intervals:none  ST&T Change: None  RADIOLOGY CTA chest reviewed and interpreted by me with large left breast mass, no pulmonary embolism or focal infiltrate noted.  PROCEDURES:  Critical Care performed:  No  Procedures   MEDICATIONS ORDERED IN ED: Medications  morphine (PF) 4 MG/ML injection 4 mg (4 mg Intravenous Given 07/23/23 0927)  iohexol (OMNIPAQUE) 350 MG/ML injection 100 mL (100 mLs Intravenous Contrast Given 07/23/23 1018)     IMPRESSION / MDM / ASSESSMENT AND PLAN / ED COURSE  I reviewed the triage vital signs and the nursing notes.                              62 y.o. female with no significant past medical history presents to the ED with enlarging breast mass for the past couple of months, now with 2 weeks of increasing pain over her left chest as well as mid upper back.  Patient's presentation is most consistent with acute presentation with potential threat to life or bodily function.  Differential diagnosis includes, but is not limited to, malignancy, PE, pneumonia, pneumothorax, rib fracture, spinal fracture, ACS, anemia, electrolyte abnormality, AKI.  Patient nontoxic-appearing and in no acute distress, vital signs remarkable for tachycardia but otherwise reassuring.  She does have a large firm breast mass on the left with no signs of infection, does also have tenderness over her left chest wall and middle of her upper back.  I am concerned for breast cancer and with her pleuritic chest pain, we will further assess with CTA of her chest, also check CT of her thoracic spine.  She additionally has distention in her abdomen with diffuse tenderness and apparent hepatomegaly, will check CT of her abdomen/pelvis with imaging of her lumbar spine.  Labs are pending at this time, we will treat pain with IV morphine.  CTA chest is negative for PE or pneumonia, does show large breast mass concerning for malignancy with multiple sclerotic lesions to bone as well as apparent metastatic lesions to the liver.  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  Case discussed with Dr. Orlie Dakin of oncology, who will follow  during admission and has contacted palliative care to follow with the patient as well.  Labs show mild leukocytosis with no significant anemia, electrolyte abnormality, or AKI.  Patient does have transaminitis with elevation in bilirubin consistent with metastatic disease to the liver.  Case discussed with hospitalist for admission.      FINAL CLINICAL IMPRESSION(S) / ED DIAGNOSES   Final diagnoses:  Metastatic malignant neoplasm, unspecified site (HCC)  Acute midline thoracic back pain     Rx / DC  Orders   ED Discharge Orders     None        Note:  This document was prepared using Dragon voice recognition software and may include unintentional dictation errors.   Chesley Noon, MD 07/23/23 1357

## 2023-07-24 DIAGNOSIS — Z515 Encounter for palliative care: Secondary | ICD-10-CM | POA: Diagnosis not present

## 2023-07-24 DIAGNOSIS — C799 Secondary malignant neoplasm of unspecified site: Secondary | ICD-10-CM | POA: Diagnosis not present

## 2023-07-24 DIAGNOSIS — C7951 Secondary malignant neoplasm of bone: Secondary | ICD-10-CM

## 2023-07-24 DIAGNOSIS — C50912 Malignant neoplasm of unspecified site of left female breast: Principal | ICD-10-CM

## 2023-07-24 LAB — COMPREHENSIVE METABOLIC PANEL
ALT: 51 U/L — ABNORMAL HIGH (ref 0–44)
AST: 279 U/L — ABNORMAL HIGH (ref 15–41)
Albumin: 2.5 g/dL — ABNORMAL LOW (ref 3.5–5.0)
Alkaline Phosphatase: 153 U/L — ABNORMAL HIGH (ref 38–126)
Anion gap: 10 (ref 5–15)
BUN: 23 mg/dL (ref 8–23)
CO2: 24 mmol/L (ref 22–32)
Calcium: 9.5 mg/dL (ref 8.9–10.3)
Chloride: 102 mmol/L (ref 98–111)
Creatinine, Ser: 1.12 mg/dL — ABNORMAL HIGH (ref 0.44–1.00)
GFR, Estimated: 56 mL/min — ABNORMAL LOW (ref >60)
Glucose, Bld: 78 mg/dL (ref 70–99)
Potassium: 3.9 mmol/L (ref 3.5–5.1)
Sodium: 136 mmol/L (ref 135–145)
Total Bilirubin: 4.6 mg/dL — ABNORMAL HIGH (ref 0.3–1.2)
Total Protein: 6.7 g/dL (ref 6.5–8.1)

## 2023-07-24 LAB — CBC
HCT: 34.1 % — ABNORMAL LOW (ref 36.0–46.0)
Hemoglobin: 11 g/dL — ABNORMAL LOW (ref 12.0–15.0)
MCH: 29.5 pg (ref 26.0–34.0)
MCHC: 32.3 g/dL (ref 30.0–36.0)
MCV: 91.4 fL (ref 80.0–100.0)
Platelets: 147 10*3/uL — ABNORMAL LOW (ref 150–400)
RBC: 3.73 MIL/uL — ABNORMAL LOW (ref 3.87–5.11)
RDW: 19.2 % — ABNORMAL HIGH (ref 11.5–15.5)
WBC: 11.7 10*3/uL — ABNORMAL HIGH (ref 4.0–10.5)
nRBC: 4 % — ABNORMAL HIGH (ref 0.0–0.2)

## 2023-07-24 LAB — CREATININE, SERUM
Creatinine, Ser: 1.39 mg/dL — ABNORMAL HIGH (ref 0.44–1.00)
GFR, Estimated: 43 mL/min — ABNORMAL LOW (ref >60)

## 2023-07-24 MED ORDER — FOLIC ACID 1 MG PO TABS: 1.0000 mg | ORAL_TABLET | Freq: Every day | ORAL | Status: DC

## 2023-07-24 MED ORDER — SODIUM CHLORIDE 0.9 % IV SOLN: INTRAVENOUS | Status: AC

## 2023-07-24 MED ORDER — ADULT MULTIVITAMIN W/MINERALS CH: 1.0000 | ORAL_TABLET | Freq: Every day | ORAL | Status: DC

## 2023-07-24 MED ORDER — ENSURE ENLIVE PO LIQD: 237.0000 mL | Freq: Two times a day (BID) | ORAL | Status: DC

## 2023-07-24 MED ORDER — OXYCODONE HCL 5 MG PO TABS: 5.0000 mg | ORAL_TABLET | Freq: Four times a day (QID) | ORAL | 0 refills | Status: DC | PRN

## 2023-07-24 MED ORDER — OXYCODONE HCL 5 MG PO TABS: 5.0000 mg | ORAL_TABLET | ORAL | Status: DC | PRN

## 2023-07-24 MED ORDER — VITAMIN B-1 100 MG PO TABS: 100.0000 mg | ORAL_TABLET | Freq: Every day | ORAL | Status: DC

## 2023-07-24 MED ORDER — ENSURE ENLIVE PO LIQD
237.0000 mL | Freq: Two times a day (BID) | ORAL | Status: DC
  Administered 2023-07-24 – 2023-07-25 (×4): 237 mL via ORAL

## 2023-07-24 NOTE — Consult Note (Addendum)
[facility]  [phone] [phone]  ID: Carrie Jones OB: [birth year]  MR#: [id number]  AOZ#:[id number]  Patient Care Team: Patient, No Pcp Per as PCP - General (General Practice)  CHIEF COMPLAINT: Large left breast mass with metastatic lesions.  INTERVAL HISTORY: Patient is a 62 year old female who has not seen any physician in a long time presents to the emergency room with persistent left breast pain.  Patient reports that she has known a mass growing and therefore some time, but did not seek medical attention until the pain became unbearable.  She feels improved since admission.  She has chronic back pain, but otherwise feels well.  She has no neurologic complaints.  She denies any recent fevers or illnesses.  She has good appetite and denies weight loss.  She has no chest pain, shortness of breath, cough, or hemoptysis.  She denies any nausea, vomiting, constipation, or diarrhea.  She has no urinary complaints.  Patient offers no further specific complaints today.  REVIEW OF SYSTEMS:   Review of Systems  Constitutional: Negative.  Negative for fever, malaise/fatigue and weight loss.  Respiratory: Negative.  Negative for cough, hemoptysis and shortness of breath.   Cardiovascular: Negative.  Negative for chest pain and leg swelling.  Gastrointestinal: Negative.  Negative for abdominal pain.  Genitourinary: Negative.  Negative for dysuria.  Musculoskeletal:  Positive for back pain.  Neurological: Negative.  Negative for dizziness, focal weakness, weakness and headaches.  Psychiatric/Behavioral: Negative.  The patient is not nervous/anxious.     As per HPI. Otherwise, a complete review of systems is negative.  PAST MEDICAL HISTORY: History reviewed. No pertinent past medical history.  PAST SURGICAL HISTORY: History reviewed. No pertinent surgical history.  FAMILY HISTORY: History reviewed. No pertinent family history.  ADVANCED DIRECTIVES  (Y/N):  @ADVDIR @  HEALTH MAINTENANCE: Social History   Tobacco Use   Smoking status: Every Day    Current packs/day: 0.50    Types: Cigarettes   Smokeless tobacco: Never  Vaping Use   Vaping status: Never Used  Substance Use Topics   Alcohol use: Yes    Comment: occas.    Drug use: Never     Colonoscopy:  PAP:  Bone density:  Lipid panel:  No Known Allergies  Current Facility-Administered Medications  Medication Dose Route Frequency Provider Last Rate Last Admin   enoxaparin (LOVENOX) injection 55 mg  1 mg/kg Subcutaneous Q12H Barrie Folk, RPH   55 mg at 07/24/23 0344   feeding supplement (ENSURE ENLIVE / ENSURE PLUS) liquid 237 mL  237 mL Oral BID BM Darlin Priestly, MD   237 mL at 07/24/23 1021   folic acid (FOLVITE) tablet 1 mg  1 mg Oral Daily Floydene Flock, MD   1 mg at 07/24/23 1017   LORazepam (ATIVAN) tablet 1-4 mg  1-4 mg Oral Q1H PRN Floydene Flock, MD       Or   LORazepam (ATIVAN) tablet 0.5 mg  0.5 mg Oral Q4H PRN Floydene Flock, MD       multivitamin with minerals tablet 1 tablet  1 tablet Oral Daily Floydene Flock, MD   1 tablet at 07/24/23 1017   ondansetron (ZOFRAN) tablet 4 mg  4 mg Oral Q6H PRN Floydene Flock, MD       Or   ondansetron [facility]) injection 4 mg  4 mg Intravenous Q6H PRN Floydene Flock, MD   4 mg at 07/23/23 2204   oxyCODONE (Oxy IR/ROXICODONE) immediate release  tablet 5-10 mg  5-10 mg Oral Q4H PRN Darlin Priestly, MD       thiamine (VITAMIN B1) tablet 100 mg  100 mg Oral Daily Floydene Flock, MD   100 mg at 07/24/23 1017   Or   thiamine (VITAMIN B1) injection 100 mg  100 mg Intravenous Daily Floydene Flock, MD        OBJECTIVE: Vitals:   07/24/23 1029 07/24/23 1031  BP:    Pulse: 100 99  Resp: 20 18  Temp:    SpO2: 90% 97%     Body mass index is 23.81 kg/m.    ECOG FS:1 - Symptomatic but completely ambulatory  General: Well-developed, well-nourished, no acute distress. Eyes: Pink conjunctiva, anicteric  sclera. HEENT: Normocephalic, moist mucous membranes. Breast: Large easily palpable left breast mass confluent with left axillary lymphadenopathy. Lungs: No audible wheezing or coughing. Heart: Regular rate and rhythm. Abdomen: Soft, nontender, no obvious distention. Musculoskeletal: No edema, cyanosis, or clubbing. Neuro: Alert, answering all questions appropriately. Cranial nerves grossly intact. Skin: No rashes or petechiae noted. Psych: Normal affect.   LAB RESULTS:  Lab Results  Component Value Date   NA 136 07/24/2023   K 3.9 07/24/2023   CL 102 07/24/2023   CO2 24 07/24/2023   GLUCOSE 78 07/24/2023   BUN 23 07/24/2023   CREATININE 1.39 (H) 07/24/2023   CALCIUM 9.5 07/24/2023   PROT 6.7 07/24/2023   ALBUMIN 2.5 (L) 07/24/2023   AST 279 (H) 07/24/2023   ALT 51 (H) 07/24/2023   ALKPHOS 153 (H) 07/24/2023   BILITOT 4.6 (H) 07/24/2023   GFRNONAA 43 (L) 07/24/2023    Lab Results  Component Value Date   WBC 11.7 (H) 07/24/2023   NEUTROABS 10.5 (H) 07/23/2023   HGB 11.0 (L) 07/24/2023   HCT 34.1 (L) 07/24/2023   MCV 91.4 07/24/2023   PLT 147 (L) 07/24/2023     STUDIES: CT T-SPINE NO CHARGE  Result Date: 07/23/2023 CLINICAL DATA:  Mid back pain, pain to left lateral chest wall EXAM: CT THORACIC AND LUMBAR SPINE WITHOUT CONTRAST TECHNIQUE: Multidetector CT imaging of the thoracic and lumbar spine was performed without contrast. Multiplanar CT image reconstructions were also generated. RADIATION DOSE REDUCTION: This exam was performed according to the departmental dose-optimization program which includes automated exposure control, adjustment of the mA and/or kV according to patient size and/or use of iterative reconstruction technique. COMPARISON:  None Available. FINDINGS: CT THORACIC SPINE FINDINGS Alignment: No listhesis. Preservation of the normal thoracic kyphosis. Levocurvature of the upper to midthoracic spine and dextrocurvature of the thoracolumbar junction.  Vertebrae: Lytic lesions are suspected in the right aspect of T3 (series 3, image 22), the right aspect of T8 and T9 (series 3, image 67 and 79), and the left aspects of T11 and T12 (series 3, images 98 and 112); these lesions are quite subtle on CT, with the exception of T12 lesion. Vertebral body heights are preserved. No evidence of acute fracture. Paraspinal and other soft tissues: Please see same-day CT chest. Disc levels: Mild degenerative changes, without significant spinal canal stenosis. CT LUMBAR SPINE FINDINGS Segmentation: 5 lumbar type vertebral bodies. Alignment: Mild levocurvature.  No significant listhesis. Vertebrae: Lytic lesion in the left aspect of L1, with expansile soft tissue component (series 6, image 18). There is likely epidural extension along the right aspect of the tumor (series 6, image 17). No other suspected lesions in the lumbar spine. Vertebral body heights are preserved. No acute fracture. Paraspinal and other soft  tissues: Please see same-day CT abdomen pelvis. Disc levels: Disc heights are preserved. No significant spinal canal stenosis or neural foraminal narrowing. IMPRESSION: 1. Lytic lesion in the left aspect of L1, with expansile soft tissue component and likely epidural extension along the right aspect of the tumor. Additional lytic lesions are suspected in T3, T8, T9, T11, and T12. MRI of the thoracic and lumbar spine with and without contrast is recommended for further evaluation. 2. No acute fracture or traumatic listhesis in the thoracic or lumbar spine. 3. For soft tissue findings, please see same-day CT chest abdomen pelvis. Electronically Signed   By: Wiliam Ke M.D.   On: 07/23/2023 12:53   CT L-SPINE NO CHARGE  Result Date: 07/23/2023 CLINICAL DATA:  Mid back pain, pain to left lateral chest wall EXAM: CT THORACIC AND LUMBAR SPINE WITHOUT CONTRAST TECHNIQUE: Multidetector CT imaging of the thoracic and lumbar spine was performed without contrast.  Multiplanar CT image reconstructions were also generated. RADIATION DOSE REDUCTION: This exam was performed according to the departmental dose-optimization program which includes automated exposure control, adjustment of the mA and/or kV according to patient size and/or use of iterative reconstruction technique. COMPARISON:  None Available. FINDINGS: CT THORACIC SPINE FINDINGS Alignment: No listhesis. Preservation of the normal thoracic kyphosis. Levocurvature of the upper to midthoracic spine and dextrocurvature of the thoracolumbar junction. Vertebrae: Lytic lesions are suspected in the right aspect of T3 (series 3, image 22), the right aspect of T8 and T9 (series 3, image 67 and 79), and the left aspects of T11 and T12 (series 3, images 98 and 112); these lesions are quite subtle on CT, with the exception of T12 lesion. Vertebral body heights are preserved. No evidence of acute fracture. Paraspinal and other soft tissues: Please see same-day CT chest. Disc levels: Mild degenerative changes, without significant spinal canal stenosis. CT LUMBAR SPINE FINDINGS Segmentation: 5 lumbar type vertebral bodies. Alignment: Mild levocurvature.  No significant listhesis. Vertebrae: Lytic lesion in the left aspect of L1, with expansile soft tissue component (series 6, image 18). There is likely epidural extension along the right aspect of the tumor (series 6, image 17). No other suspected lesions in the lumbar spine. Vertebral body heights are preserved. No acute fracture. Paraspinal and other soft tissues: Please see same-day CT abdomen pelvis. Disc levels: Disc heights are preserved. No significant spinal canal stenosis or neural foraminal narrowing. IMPRESSION: 1. Lytic lesion in the left aspect of L1, with expansile soft tissue component and likely epidural extension along the right aspect of the tumor. Additional lytic lesions are suspected in T3, T8, T9, T11, and T12. MRI of the thoracic and lumbar spine with and  without contrast is recommended for further evaluation. 2. No acute fracture or traumatic listhesis in the thoracic or lumbar spine. 3. For soft tissue findings, please see same-day CT chest abdomen pelvis. Electronically Signed   By: Wiliam Ke M.D.   On: 07/23/2023 12:53   CT Angio Chest PE W/Cm &/Or Wo Cm  Result Date: 07/23/2023 CLINICAL DATA:  Mid back pain that began 2 weeks ago. * Tracking Code: BO * EXAM: CT ANGIOGRAPHY CHEST CT ABDOMEN AND PELVIS WITH CONTRAST TECHNIQUE: Multidetector CT imaging of the chest was performed using the standard protocol during bolus administration of intravenous contrast. Multiplanar CT image reconstructions and MIPs were obtained to evaluate the vascular anatomy. Multidetector CT imaging of the abdomen and pelvis was performed using the standard protocol during bolus administration of intravenous contrast. RADIATION DOSE REDUCTION: This  exam was performed according to the departmental dose-optimization program which includes automated exposure control, adjustment of the mA and/or kV according to patient size and/or use of iterative reconstruction technique. CONTRAST:  OMNIPAQUE IOHEXOL 350 MG/ML SOLN COMPARISON:  None Available. FINDINGS: CTA CHEST FINDINGS Cardiovascular: Heart is slightly enlarged. No significant pericardial effusion. Coronary artery calcifications are seen. The thoracic aorta has a normal course and caliber with some mild atherosclerotic plaque. There is some enlargement of the main pulmonary arteries. Please correlate for pulmonary artery hypertension. No segmental or larger pulmonary embolism identified. There is some breathing motion which can limit evaluation of small and peripheral emboli. Mediastinum/Nodes: Normal caliber thoracic esophagus. Preserved thyroid gland. There is no specific abnormal lymph node enlargement identified in the right axillary region. There are some small bilateral hilar nodes. Example left left hilum on series 2,  image 63 measures 7 mm in short axis. Right hilar node on series 2, image 55 has short axis measurement of 8 mm. There is 1 truly enlarged left hilar node on series 2, image 58 measuring 2.2 1.5 cm. There are some small mediastinal nodes as well. Example subcarinal node has short axis on series 2, image 58 measuring 13 mm. There is fullness in the AP window with a thickness approaching 10 mm on series 2, image 43. Small nodes prevascular. There is also an abnormal left internal mammary chain node measuring 10 x 9 mm on series 2, image 36. There is extensive abnormal tissue, presumed nodes in the left axillary region with a left-sided large breast mass on series 2, image 75, asymmetric from the right side. Please correlate with clinical history. There is associated skin thickening and anasarca. Lungs/Pleura: No consolidation, pneumothorax. Trace pleural fluid. Basilar areas of bandlike opacity with ground-glass. Underlying centrilobular emphysematous changes diffusely. There is also presence of a left upper lobe nodule on series 4 image 48 measuring 10 by 9 mm. Additional small nodule left lower lobe on series 4, image 98. small amount of presumed debris along the trachea. Musculoskeletal: Please see separate dictation of spine CT examinations. There are some lytic lesions identified such as the right eighth rib on series 4, image 112. Few other areas suggested as well. Review of the MIP images confirms the above findings. CT ABDOMEN and PELVIS FINDINGS Hepatobiliary: There are numerous small low-attenuation lesions scattered throughout the liver worrisome for metastatic disease. There is a large geographic areas of low-density in the right hepatic lobe consistent with fatty infiltration. In this area there appears to be areas of portal vein branch thrombosis. Changes of potential pseudo cirrhosis. Gallbladder is nondilated. Pancreas: Unremarkable. No pancreatic ductal dilatation or surrounding inflammatory changes.  Spleen: Normal in size without focal abnormality. Adrenals/Urinary Tract: Adrenal glands are preserved. No enhancing renal mass or collecting system dilatation. Preserved contours of the urinary bladder. Stomach/Bowel: No oral contrast. The stomach is decompressed. The wall thickening along the pylorus could be related to level of distention. The small bowel is nondilated. Large bowel has a normal course and caliber with scattered stool and diverticulosis. Normal appendix in the right lower quadrant inferior to the cecum. Vascular/Lymphatic: Scattered vascular calcifications. Normal caliber aorta and IVC. There are several abnormal nodes identified in the upper abdomen. Example portacaval series 3, image 27 measures 2.3 1.4 cm. Gastrohepatic ligament series 3, image 22 measures 17 by 15 mm. Several in the porta hepatis, some Peri aortic nodes as well. Reproductive: Multiple calcified uterine fibroids. No separate adnexal mass. There is some nodular tissue  in the area of the perineum on the left side along the vulva such as best seen coronal series 6, image 49 measuring 2.1 cm. Please correlate with direct visualization. Other: Diffuse ascites.  Anasarca. Musculoskeletal: There is some faint lytic lesions along the spine and pelvis. Please see separate spinal imaging. Spine lesions include L1. Trace anterolisthesis of L5 on S1. Review of the MIP images confirms the above findings. Critical Value/emergent results were called by telephone at the time of interpretation on 07/23/2023 at 9:26 am to provider [facility] , who verbally acknowledged these results. IMPRESSION: Large infiltrative left-sided breast mass with skin thickening and edema. Extensive soft tissue in the axilla which could represent abnormal nodes and direct spread. In addition there are several abnormal lymph nodes in the chest and abdomen including lung hilum, mediastinum, internal mammary chain, supraclavicular, retroperitoneum, gastrohepatic  ligament, porta hepatis. Liver has a extensive low-attenuation lesions consistent with metastatic disease. In addition there is large geographic areas of fatty infiltration involving multiple segments with what appears to be areas of portal vein thrombosis in these locations. 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 Few lytic bone lesions identified along the spine and ribs. Separate subtle 2 cm proximally mass along the left side of the vulva. Please correlate with direct visualization. Colonic diverticulosis.  No bowel obstruction or free air. Trace pleural fluid. No segmental or larger pulmonary embolism. Electronically Signed   By: Karen Kays M.D.   On: 07/23/2023 12:29   CT ABDOMEN PELVIS W CONTRAST  Result Date: 07/23/2023 CLINICAL DATA:  Mid back pain that began 2 weeks ago. * Tracking Code: BO * EXAM: CT ANGIOGRAPHY CHEST CT ABDOMEN AND PELVIS WITH CONTRAST TECHNIQUE: Multidetector CT imaging of the chest was performed using the standard protocol during bolus administration of intravenous contrast. Multiplanar CT image reconstructions and MIPs were obtained to evaluate the vascular anatomy. Multidetector CT imaging of the abdomen and pelvis was performed using the standard protocol during bolus administration of intravenous contrast. RADIATION DOSE REDUCTION: This exam was performed according to the departmental dose-optimization program which includes automated exposure control, adjustment of the mA and/or kV according to patient size and/or use of iterative reconstruction technique. CONTRAST:  OMNIPAQUE IOHEXOL 350 MG/ML SOLN COMPARISON:  None Available. FINDINGS: CTA CHEST FINDINGS Cardiovascular: Heart is slightly enlarged. No significant pericardial effusion. Coronary artery calcifications are seen. The thoracic aorta has a normal course and caliber with some mild atherosclerotic plaque. There is some enlargement of the main pulmonary arteries. Please  correlate for pulmonary artery hypertension. No segmental or larger pulmonary embolism identified. There is some breathing motion which can limit evaluation of small and peripheral emboli. Mediastinum/Nodes: Normal caliber thoracic esophagus. Preserved thyroid gland. There is no specific abnormal lymph node enlargement identified in the right axillary region. There are some small bilateral hilar nodes. Example left left hilum on series 2, image 63 measures 7 mm in short axis. Right hilar node on series 2, image 55 has short axis measurement of 8 mm. There is 1 truly enlarged left hilar node on series 2, image 58 measuring 2.2 1.5 cm. There are some small mediastinal nodes as well. Example subcarinal node has short axis on series 2, image 58 measuring 13 mm. There is fullness in the AP window with a thickness approaching 10 mm on series 2, image 43. Small nodes prevascular. There is also an abnormal left internal mammary chain node measuring 10 x 9 mm on series 2, image 36. There  is extensive abnormal tissue, presumed nodes in the left axillary region with a left-sided large breast mass on series 2, image 75, asymmetric from the right side. Please correlate with clinical history. There is associated skin thickening and anasarca. Lungs/Pleura: No consolidation, pneumothorax. Trace pleural fluid. Basilar areas of bandlike opacity with ground-glass. Underlying centrilobular emphysematous changes diffusely. There is also presence of a left upper lobe nodule on series 4 image 48 measuring 10 by 9 mm. Additional small nodule left lower lobe on series 4, image 98. small amount of presumed debris along the trachea. Musculoskeletal: Please see separate dictation of spine CT examinations. There are some lytic lesions identified such as the right eighth rib on series 4, image 112. Few other areas suggested as well. Review of the MIP images confirms the above findings. CT ABDOMEN and PELVIS FINDINGS Hepatobiliary: There are  numerous small low-attenuation lesions scattered throughout the liver worrisome for metastatic disease. There is a large geographic areas of low-density in the right hepatic lobe consistent with fatty infiltration. In this area there appears to be areas of portal vein branch thrombosis. Changes of potential pseudo cirrhosis. Gallbladder is nondilated. Pancreas: Unremarkable. No pancreatic ductal dilatation or surrounding inflammatory changes. Spleen: Normal in size without focal abnormality. Adrenals/Urinary Tract: Adrenal glands are preserved. No enhancing renal mass or collecting system dilatation. Preserved contours of the urinary bladder. Stomach/Bowel: No oral contrast. The stomach is decompressed. The wall thickening along the pylorus could be related to level of distention. The small bowel is nondilated. Large bowel has a normal course and caliber with scattered stool and diverticulosis. Normal appendix in the right lower quadrant inferior to the cecum. Vascular/Lymphatic: Scattered vascular calcifications. Normal caliber aorta and IVC. There are several abnormal nodes identified in the upper abdomen. Example portacaval series 3, image 27 measures 2.3 1.4 cm. Gastrohepatic ligament series 3, image 22 measures 17 by 15 mm. Several in the porta hepatis, some Peri aortic nodes as well. Reproductive: Multiple calcified uterine fibroids. No separate adnexal mass. There is some nodular tissue in the area of the perineum on the left side along the vulva such as best seen coronal series 6, image 49 measuring 2.1 cm. Please correlate with direct visualization. Other: Diffuse ascites.  Anasarca. Musculoskeletal: There is some faint lytic lesions along the spine and pelvis. Please see separate spinal imaging. Spine lesions include L1. Trace anterolisthesis of L5 on S1. Review of the MIP images confirms the above findings. Critical Value/emergent results were called by telephone at the time of interpretation on  07/23/2023 at 9:26 am to provider [facility] , who verbally acknowledged these results. IMPRESSION: Large infiltrative left-sided breast mass with skin thickening and edema. Extensive soft tissue in the axilla which could represent abnormal nodes and direct spread. In addition there are several abnormal lymph nodes in the chest and abdomen including lung hilum, mediastinum, internal mammary chain, supraclavicular, retroperitoneum, gastrohepatic ligament, porta hepatis. Liver has a extensive low-attenuation lesions consistent with metastatic disease. In addition there is large geographic areas of fatty infiltration involving multiple segments with what appears to be areas of portal vein thrombosis in these locations. 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 Few lytic bone lesions identified along the spine and ribs. Separate subtle 2 cm proximally mass along the left side of the vulva. Please correlate with direct visualization. Colonic diverticulosis.  No bowel obstruction or free air. Trace pleural fluid. No segmental or larger pulmonary embolism. Electronically Signed   By: Piedad Climes.D.  On: 07/23/2023 12:29    ASSESSMENT: Large left breast mass with metastatic lesions.  PLAN:    Large left breast mass with metastatic lesions: Imaging and physical exam consistent with left breast cancer metastasized at least bone and liver.  Patient will require mammogram, ultrasound, and biopsy which can be accomplished as an outpatient.  She will likely need a PET scan and MRI of the brain to complete the staging workup.  CA 27-29 has been ordered for completeness.  Will arrange follow-up in the cancer center after biopsy is completed. Pain: Patient reports this is significantly improved since admission. Hyperbilirubinemia/liver dysfunction: Likely secondary to metastatic disease.  Monitor. Renal insufficiency: Mild.  Patient's most recent creatinine is 1.39. Leukocytosis:  Likely reactive, monitor. Anemia: Patient's most recent hemoglobin is 11.0, monitor. Social situation: Patient reports she has minimal social support for help.  Appreciate palliative care input.  Will also consult social work upon discharge.  Appreciate consult, will follow.     Jeralyn Ruths, MD   07/24/2023 2:50 PM

## 2023-07-24 NOTE — Consult Note (Signed)
Palliative Medicine [facility] at [facility] [phone] [phone]   Name: Carrie Jones Date: 07/24/2023 MRN: [id number]  DOB: 10-27-1960  Patient Care Team: Patient, No Pcp Per as PCP - General (General Practice)    REASON FOR CONSULTATION: Carrie Jones is a 62 y.o. female with multiple medical problems including alcohol and tobacco use who presented to the emergency department for evaluation of back pain and was found to have presumed metastatic breast cancer.  Patient noticed a left breast mass approximately 5 to 6 months ago.  Palliative care was consulted to address goals.  SOCIAL HISTORY:     reports that she has been smoking cigarettes. She has never used smokeless tobacco. She reports current alcohol use. She reports that she does not use drugs.  Patient is unmarried and has no children.  She lives at home alone.  She has a sister who is a resident of a skilled nursing facility in [place].  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  ADVANCE DIRECTIVES:  Does not have  CODE STATUS: DNR  PAST MEDICAL HISTORY:History reviewed. No pertinent past medical history.  PAST SURGICAL HISTORY: History reviewed. No pertinent surgical history.  HEMATOLOGY/ONCOLOGY HISTORY:  Oncology History   No history exists.    ALLERGIES:  has No Known Allergies.  MEDICATIONS:  Current Facility-Administered Medications  Medication Dose Route Frequency Provider Last Rate Last Admin   enoxaparin (LOVENOX) injection 55 mg  1 mg/kg Subcutaneous Q12H Barrie Folk, RPH   55 mg at 07/24/23 0344   feeding supplement (ENSURE ENLIVE / ENSURE PLUS) liquid 237 mL  237 mL Oral BID BM Darlin Priestly, MD   237 mL at 07/24/23 1021   folic acid (FOLVITE) tablet 1 mg  1 mg Oral Daily Floydene Flock, MD   1 mg at 07/24/23 1017   LORazepam (ATIVAN) tablet 1-4 mg  1-4 mg Oral Q1H PRN Floydene Flock, MD       Or   LORazepam (ATIVAN) tablet 0.5 mg  0.5  mg Oral Q4H PRN Floydene Flock, MD       morphine (PF) 2 MG/ML injection 2 mg  2 mg Intravenous Q2H PRN Floydene Flock, MD   2 mg at 07/23/23 2203   multivitamin with minerals tablet 1 tablet  1 tablet Oral Daily Floydene Flock, MD   1 tablet at 07/24/23 1017   ondansetron (ZOFRAN) tablet 4 mg  4 mg Oral Q6H PRN Floydene Flock, MD       Or   ondansetron [facility]) injection 4 mg  4 mg Intravenous Q6H PRN Floydene Flock, MD   4 mg at 07/23/23 2204   thiamine (VITAMIN B1) tablet 100 mg  100 mg Oral Daily Floydene Flock, MD   100 mg at 07/24/23 1017   Or   thiamine (VITAMIN B1) injection 100 mg  100 mg Intravenous Daily Floydene Flock, MD        VITAL SIGNS: BP 98/64 (BP Location: Right Arm)   Pulse 100   Temp 98.3 F (36.8 C) (Oral)   Resp 20   Ht 5' (1.524 m)   Wt 121 lb 14.6 oz (55.3 kg)   SpO2 97%   BMI 23.81 kg/m  Filed Weights   07/23/23 0837  Weight: 121 lb 14.6 oz (55.3 kg)    Estimated body mass index is 23.81 kg/m as calculated from the following:   Height as of this encounter: 5' (1.524  m).   Weight as of this encounter: 121 lb 14.6 oz (55.3 kg).  LABS: CBC:    Component Value Date/Time   WBC 11.7 (H) 07/24/2023 0450   HGB 11.0 (L) 07/24/2023 0450   HCT 34.1 (L) 07/24/2023 0450   PLT 147 (L) 07/24/2023 0450   MCV 91.4 07/24/2023 0450   NEUTROABS 10.5 (H) 07/23/2023 0924   LYMPHSABS 1.7 07/23/2023 0924   MONOABS 0.9 07/23/2023 0924   EOSABS 0.1 07/23/2023 0924   BASOSABS 0.0 07/23/2023 0924   Comprehensive Metabolic Panel:    Component Value Date/Time   NA 136 07/24/2023 0450   K 3.9 07/24/2023 0450   CL 102 07/24/2023 0450   CO2 24 07/24/2023 0450   BUN 23 07/24/2023 0450   CREATININE 1.12 (H) 07/24/2023 0450   GLUCOSE 78 07/24/2023 0450   CALCIUM 9.5 07/24/2023 0450   AST 279 (H) 07/24/2023 0450   ALT 51 (H) 07/24/2023 0450   ALKPHOS 153 (H) 07/24/2023 0450   BILITOT 4.6 (H) 07/24/2023 0450   PROT 6.7 07/24/2023 0450   ALBUMIN 2.5 (L)  07/24/2023 0450    RADIOGRAPHIC STUDIES: CT T-SPINE NO CHARGE  Result Date: 07/23/2023 CLINICAL DATA:  Mid back pain, pain to left lateral chest wall EXAM: CT THORACIC AND LUMBAR SPINE WITHOUT CONTRAST TECHNIQUE: Multidetector CT imaging of the thoracic and lumbar spine was performed without contrast. Multiplanar CT image reconstructions were also generated. RADIATION DOSE REDUCTION: This exam was performed according to the departmental dose-optimization program which includes automated exposure control, adjustment of the mA and/or kV according to patient size and/or use of iterative reconstruction technique. COMPARISON:  None Available. FINDINGS: CT THORACIC SPINE FINDINGS Alignment: No listhesis. Preservation of the normal thoracic kyphosis. Levocurvature of the upper to midthoracic spine and dextrocurvature of the thoracolumbar junction. Vertebrae: Lytic lesions are suspected in the right aspect of T3 (series 3, image 22), the right aspect of T8 and T9 (series 3, image 67 and 79), and the left aspects of T11 and T12 (series 3, images 98 and 112); these lesions are quite subtle on CT, with the exception of T12 lesion. Vertebral body heights are preserved. No evidence of acute fracture. Paraspinal and other soft tissues: Please see same-day CT chest. Disc levels: Mild degenerative changes, without significant spinal canal stenosis. CT LUMBAR SPINE FINDINGS Segmentation: 5 lumbar type vertebral bodies. Alignment: Mild levocurvature.  No significant listhesis. Vertebrae: Lytic lesion in the left aspect of L1, with expansile soft tissue component (series 6, image 18). There is likely epidural extension along the right aspect of the tumor (series 6, image 17). No other suspected lesions in the lumbar spine. Vertebral body heights are preserved. No acute fracture. Paraspinal and other soft tissues: Please see same-day CT abdomen pelvis. Disc levels: Disc heights are preserved. No significant spinal canal stenosis  or neural foraminal narrowing. IMPRESSION: 1. Lytic lesion in the left aspect of L1, with expansile soft tissue component and likely epidural extension along the right aspect of the tumor. Additional lytic lesions are suspected in T3, T8, T9, T11, and T12. MRI of the thoracic and lumbar spine with and without contrast is recommended for further evaluation. 2. No acute fracture or traumatic listhesis in the thoracic or lumbar spine. 3. For soft tissue findings, please see same-day CT chest abdomen pelvis. Electronically Signed   By: Wiliam Ke M.D.   On: 07/23/2023 12:53   CT L-SPINE NO CHARGE  Result Date: 07/23/2023 CLINICAL DATA:  Mid back pain, pain to left  lateral chest wall EXAM: CT THORACIC AND LUMBAR SPINE WITHOUT CONTRAST TECHNIQUE: Multidetector CT imaging of the thoracic and lumbar spine was performed without contrast. Multiplanar CT image reconstructions were also generated. RADIATION DOSE REDUCTION: This exam was performed according to the departmental dose-optimization program which includes automated exposure control, adjustment of the mA and/or kV according to patient size and/or use of iterative reconstruction technique. COMPARISON:  None Available. FINDINGS: CT THORACIC SPINE FINDINGS Alignment: No listhesis. Preservation of the normal thoracic kyphosis. Levocurvature of the upper to midthoracic spine and dextrocurvature of the thoracolumbar junction. Vertebrae: Lytic lesions are suspected in the right aspect of T3 (series 3, image 22), the right aspect of T8 and T9 (series 3, image 67 and 79), and the left aspects of T11 and T12 (series 3, images 98 and 112); these lesions are quite subtle on CT, with the exception of T12 lesion. Vertebral body heights are preserved. No evidence of acute fracture. Paraspinal and other soft tissues: Please see same-day CT chest. Disc levels: Mild degenerative changes, without significant spinal canal stenosis. CT LUMBAR SPINE FINDINGS Segmentation: 5 lumbar  type vertebral bodies. Alignment: Mild levocurvature.  No significant listhesis. Vertebrae: Lytic lesion in the left aspect of L1, with expansile soft tissue component (series 6, image 18). There is likely epidural extension along the right aspect of the tumor (series 6, image 17). No other suspected lesions in the lumbar spine. Vertebral body heights are preserved. No acute fracture. Paraspinal and other soft tissues: Please see same-day CT abdomen pelvis. Disc levels: Disc heights are preserved. No significant spinal canal stenosis or neural foraminal narrowing. IMPRESSION: 1. Lytic lesion in the left aspect of L1, with expansile soft tissue component and likely epidural extension along the right aspect of the tumor. Additional lytic lesions are suspected in T3, T8, T9, T11, and T12. MRI of the thoracic and lumbar spine with and without contrast is recommended for further evaluation. 2. No acute fracture or traumatic listhesis in the thoracic or lumbar spine. 3. For soft tissue findings, please see same-day CT chest abdomen pelvis. Electronically Signed   By: Wiliam Ke M.D.   On: 07/23/2023 12:53   CT Angio Chest PE W/Cm &/Or Wo Cm  Result Date: 07/23/2023 CLINICAL DATA:  Mid back pain that began 2 weeks ago. * Tracking Code: BO * EXAM: CT ANGIOGRAPHY CHEST CT ABDOMEN AND PELVIS WITH CONTRAST TECHNIQUE: Multidetector CT imaging of the chest was performed using the standard protocol during bolus administration of intravenous contrast. Multiplanar CT image reconstructions and MIPs were obtained to evaluate the vascular anatomy. Multidetector CT imaging of the abdomen and pelvis was performed using the standard protocol during bolus administration of intravenous contrast. RADIATION DOSE REDUCTION: This exam was performed according to the departmental dose-optimization program which includes automated exposure control, adjustment of the mA and/or kV according to patient size and/or use of iterative  reconstruction technique. CONTRAST:  OMNIPAQUE IOHEXOL 350 MG/ML SOLN COMPARISON:  None Available. FINDINGS: CTA CHEST FINDINGS Cardiovascular: Heart is slightly enlarged. No significant pericardial effusion. Coronary artery calcifications are seen. The thoracic aorta has a normal course and caliber with some mild atherosclerotic plaque. There is some enlargement of the main pulmonary arteries. Please correlate for pulmonary artery hypertension. No segmental or larger pulmonary embolism identified. There is some breathing motion which can limit evaluation of small and peripheral emboli. Mediastinum/Nodes: Normal caliber thoracic esophagus. Preserved thyroid gland. There is no specific abnormal lymph node enlargement identified in the right axillary region. There are  some small bilateral hilar nodes. Example left left hilum on series 2, image 63 measures 7 mm in short axis. Right hilar node on series 2, image 55 has short axis measurement of 8 mm. There is 1 truly enlarged left hilar node on series 2, image 58 measuring 2.2 1.5 cm. There are some small mediastinal nodes as well. Example subcarinal node has short axis on series 2, image 58 measuring 13 mm. There is fullness in the AP window with a thickness approaching 10 mm on series 2, image 43. Small nodes prevascular. There is also an abnormal left internal mammary chain node measuring 10 x 9 mm on series 2, image 36. There is extensive abnormal tissue, presumed nodes in the left axillary region with a left-sided large breast mass on series 2, image 75, asymmetric from the right side. Please correlate with clinical history. There is associated skin thickening and anasarca. Lungs/Pleura: No consolidation, pneumothorax. Trace pleural fluid. Basilar areas of bandlike opacity with ground-glass. Underlying centrilobular emphysematous changes diffusely. There is also presence of a left upper lobe nodule on series 4 image 48 measuring 10 by 9 mm. Additional small  nodule left lower lobe on series 4, image 98. small amount of presumed debris along the trachea. Musculoskeletal: Please see separate dictation of spine CT examinations. There are some lytic lesions identified such as the right eighth rib on series 4, image 112. Few other areas suggested as well. Review of the MIP images confirms the above findings. CT ABDOMEN and PELVIS FINDINGS Hepatobiliary: There are numerous small low-attenuation lesions scattered throughout the liver worrisome for metastatic disease. There is a large geographic areas of low-density in the right hepatic lobe consistent with fatty infiltration. In this area there appears to be areas of portal vein branch thrombosis. Changes of potential pseudo cirrhosis. Gallbladder is nondilated. Pancreas: Unremarkable. No pancreatic ductal dilatation or surrounding inflammatory changes. Spleen: Normal in size without focal abnormality. Adrenals/Urinary Tract: Adrenal glands are preserved. No enhancing renal mass or collecting system dilatation. Preserved contours of the urinary bladder. Stomach/Bowel: No oral contrast. The stomach is decompressed. The wall thickening along the pylorus could be related to level of distention. The small bowel is nondilated. Large bowel has a normal course and caliber with scattered stool and diverticulosis. Normal appendix in the right lower quadrant inferior to the cecum. Vascular/Lymphatic: Scattered vascular calcifications. Normal caliber aorta and IVC. There are several abnormal nodes identified in the upper abdomen. Example portacaval series 3, image 27 measures 2.3 1.4 cm. Gastrohepatic ligament series 3, image 22 measures 17 by 15 mm. Several in the porta hepatis, some Peri aortic nodes as well. Reproductive: Multiple calcified uterine fibroids. No separate adnexal mass. There is some nodular tissue in the area of the perineum on the left side along the vulva such as best seen coronal series 6, image 49 measuring 2.1 cm.  Please correlate with direct visualization. Other: Diffuse ascites.  Anasarca. Musculoskeletal: There is some faint lytic lesions along the spine and pelvis. Please see separate spinal imaging. Spine lesions include L1. Trace anterolisthesis of L5 on S1. Review of the MIP images confirms the above findings. Critical Value/emergent results were called by telephone at the time of interpretation on 07/23/2023 at 9:26 am to provider [facility] , who verbally acknowledged these results. IMPRESSION: Large infiltrative left-sided breast mass with skin thickening and edema. Extensive soft tissue in the axilla which could represent abnormal nodes and direct spread. In addition there are several abnormal lymph nodes in the chest  and abdomen including lung hilum, mediastinum, internal mammary chain, supraclavicular, retroperitoneum, gastrohepatic ligament, porta hepatis. Liver has a extensive low-attenuation lesions consistent with metastatic disease. In addition there is large geographic areas of fatty infiltration involving multiple segments with what appears to be areas of portal vein thrombosis in these locations. 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 Few lytic bone lesions identified along the spine and ribs. Separate subtle 2 cm proximally mass along the left side of the vulva. Please correlate with direct visualization. Colonic diverticulosis.  No bowel obstruction or free air. Trace pleural fluid. No segmental or larger pulmonary embolism. Electronically Signed   By: Karen Kays M.D.   On: 07/23/2023 12:29   CT ABDOMEN PELVIS W CONTRAST  Result Date: 07/23/2023 CLINICAL DATA:  Mid back pain that began 2 weeks ago. * Tracking Code: BO * EXAM: CT ANGIOGRAPHY CHEST CT ABDOMEN AND PELVIS WITH CONTRAST TECHNIQUE: Multidetector CT imaging of the chest was performed using the standard protocol during bolus administration of intravenous contrast. Multiplanar CT image  reconstructions and MIPs were obtained to evaluate the vascular anatomy. Multidetector CT imaging of the abdomen and pelvis was performed using the standard protocol during bolus administration of intravenous contrast. RADIATION DOSE REDUCTION: This exam was performed according to the departmental dose-optimization program which includes automated exposure control, adjustment of the mA and/or kV according to patient size and/or use of iterative reconstruction technique. CONTRAST:  OMNIPAQUE IOHEXOL 350 MG/ML SOLN COMPARISON:  None Available. FINDINGS: CTA CHEST FINDINGS Cardiovascular: Heart is slightly enlarged. No significant pericardial effusion. Coronary artery calcifications are seen. The thoracic aorta has a normal course and caliber with some mild atherosclerotic plaque. There is some enlargement of the main pulmonary arteries. Please correlate for pulmonary artery hypertension. No segmental or larger pulmonary embolism identified. There is some breathing motion which can limit evaluation of small and peripheral emboli. Mediastinum/Nodes: Normal caliber thoracic esophagus. Preserved thyroid gland. There is no specific abnormal lymph node enlargement identified in the right axillary region. There are some small bilateral hilar nodes. Example left left hilum on series 2, image 63 measures 7 mm in short axis. Right hilar node on series 2, image 55 has short axis measurement of 8 mm. There is 1 truly enlarged left hilar node on series 2, image 58 measuring 2.2 1.5 cm. There are some small mediastinal nodes as well. Example subcarinal node has short axis on series 2, image 58 measuring 13 mm. There is fullness in the AP window with a thickness approaching 10 mm on series 2, image 43. Small nodes prevascular. There is also an abnormal left internal mammary chain node measuring 10 x 9 mm on series 2, image 36. There is extensive abnormal tissue, presumed nodes in the left axillary region with a left-sided  large breast mass on series 2, image 75, asymmetric from the right side. Please correlate with clinical history. There is associated skin thickening and anasarca. Lungs/Pleura: No consolidation, pneumothorax. Trace pleural fluid. Basilar areas of bandlike opacity with ground-glass. Underlying centrilobular emphysematous changes diffusely. There is also presence of a left upper lobe nodule on series 4 image 48 measuring 10 by 9 mm. Additional small nodule left lower lobe on series 4, image 98. small amount of presumed debris along the trachea. Musculoskeletal: Please see separate dictation of spine CT examinations. There are some lytic lesions identified such as the right eighth rib on series 4, image 112. Few other areas suggested as well. Review of the MIP images  confirms the above findings. CT ABDOMEN and PELVIS FINDINGS Hepatobiliary: There are numerous small low-attenuation lesions scattered throughout the liver worrisome for metastatic disease. There is a large geographic areas of low-density in the right hepatic lobe consistent with fatty infiltration. In this area there appears to be areas of portal vein branch thrombosis. Changes of potential pseudo cirrhosis. Gallbladder is nondilated. Pancreas: Unremarkable. No pancreatic ductal dilatation or surrounding inflammatory changes. Spleen: Normal in size without focal abnormality. Adrenals/Urinary Tract: Adrenal glands are preserved. No enhancing renal mass or collecting system dilatation. Preserved contours of the urinary bladder. Stomach/Bowel: No oral contrast. The stomach is decompressed. The wall thickening along the pylorus could be related to level of distention. The small bowel is nondilated. Large bowel has a normal course and caliber with scattered stool and diverticulosis. Normal appendix in the right lower quadrant inferior to the cecum. Vascular/Lymphatic: Scattered vascular calcifications. Normal caliber aorta and IVC. There are several abnormal  nodes identified in the upper abdomen. Example portacaval series 3, image 27 measures 2.3 1.4 cm. Gastrohepatic ligament series 3, image 22 measures 17 by 15 mm. Several in the porta hepatis, some Peri aortic nodes as well. Reproductive: Multiple calcified uterine fibroids. No separate adnexal mass. There is some nodular tissue in the area of the perineum on the left side along the vulva such as best seen coronal series 6, image 49 measuring 2.1 cm. Please correlate with direct visualization. Other: Diffuse ascites.  Anasarca. Musculoskeletal: There is some faint lytic lesions along the spine and pelvis. Please see separate spinal imaging. Spine lesions include L1. Trace anterolisthesis of L5 on S1. Review of the MIP images confirms the above findings. Critical Value/emergent results were called by telephone at the time of interpretation on 07/23/2023 at 9:26 am to provider [facility] , who verbally acknowledged these results. IMPRESSION: Large infiltrative left-sided breast mass with skin thickening and edema. Extensive soft tissue in the axilla which could represent abnormal nodes and direct spread. In addition there are several abnormal lymph nodes in the chest and abdomen including lung hilum, mediastinum, internal mammary chain, supraclavicular, retroperitoneum, gastrohepatic ligament, porta hepatis. Liver has a extensive low-attenuation lesions consistent with metastatic disease. In addition there is large geographic areas of fatty infiltration involving multiple segments with what appears to be areas of portal vein thrombosis in these locations. 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 Few lytic bone lesions identified along the spine and ribs. Separate subtle 2 cm proximally mass along the left side of the vulva. Please correlate with direct visualization. Colonic diverticulosis.  No bowel obstruction or free air. Trace pleural fluid. No segmental or larger pulmonary  embolism. Electronically Signed   By: Karen Kays M.D.   On: 07/23/2023 12:29    PERFORMANCE STATUS (ECOG) : 1 - Symptomatic but completely ambulatory  Review of Systems Unless otherwise noted, a complete review of systems is negative.  Physical Exam General: NAD Pulmonary: Unlabored Extremities: no edema, no joint deformities Skin: no rashes Neurological: Weakness but otherwise nonfocal  IMPRESSION: I met with patient to discuss goals.  She presented emergency department for evaluation of back pain and was found to have presumed metastatic breast cancer.  CTs revealed a large infiltrative left-sided breast mass with nodal involvement and extensive hepatic metastatic disease as well as several lytic bone lesions in the spine and ribs.  Patient says that she recognizes that workup appears consistent with metastatic cancer.  She was quick to state that she would not be  interested in chemotherapy as she does not wish to lose her hair.  However, she says that she is interested in workup and would consider alternative treatment options.  We did discuss the differences between quantity and quality of life in light of a potentially incurable cancer.  Will discuss with medical oncology who will also see patient to facilitate workup.  Of note, patient lives at home alone.  She has limited social support as her only sister lives at a skilled nursing facility.  Patient says that she has been independent in her immediate goal is to continue working for as long as possible.  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  PLAN: -Continue current scope of treatment -Agree with DNR -Will follow  Case and plan discussed with Dr. Orlie Dakin   Time Total: 50 minutes  Visit consisted of counseling and education dealing with the complex and emotionally intense issues of symptom management and palliative care in the setting of serious and potentially life-threatening illness.Greater than 50%  of this  time was spent counseling and coordinating care related to the above assessment and plan.  Signed by: Laurette Schimke, PhD, NP-C

## 2023-07-24 NOTE — Progress Notes (Signed)
Introduced patient to role of Statistician. Intake questions completed. Pt does NOT currently have a primary care provider, but is agreeable to establishment of care. She lives alone and has very limited family/social support. 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

## 2023-07-24 NOTE — Progress Notes (Signed)
PROGRESS NOTE    Carrie Jones  YNW:[id number] DOB: 1961/08/11 DOA: 07/23/2023 PCP: Patient, No Pcp Per  128A/128A-AA  LOS: 1 day   Brief hospital course:   Assessment & Plan: Carrie Jones is a 63 y.o. female with medical history significant of alcohol and tobacco use presenting with back pain and metastatic breast cancer.    Patient reports noticing large lump on her left breast roughly 5 to 6 months ago.  Patient reports extensive prior family history of cancer including lung and the breast.  States she did not follow-up because she was afraid of what it might be.  Baseline 1/2 pack/day smoker.  Also drinks beers heavily.     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 Lytic lesions noted on L1,  T3, T8, T9, T11, and T12. --oncology consult with Dr. Orlie Dakin, and palliative consult with Laurette Schimke --will need mammogram, ultrasound, and biopsy as outpatient, oncology to arrange. --pain management  AKI vs CKD --Cr 0.53 on presentation, increased to 1.12 the next day with recheck 1.39.  Either AKI with unknown etiology, or initial low Cr was an error. --NS@75  for 12 hours and recheck Cr tomorrow  Hyperbilirubinemia T. 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 --need outpatient gyn f/u  Tobacco abuse 1/2 pack/day smoker  Alcohol abuse Regular intake of 2-3+40 ounce beers at least every other day CIWA protocol --folic acid and thiamine  Portal vein thrombosis CT imaging concerning for portal vein thrombosis in the setting of  metastatic breast cancer --started on treatment dose Lovenox Follow-up oncology as well as palliative care recommendations   DVT prophylaxis: Lovenox SQ Code Status: DNR  Family Communication:  Level of care: Med-Surg Dispo:   The patient is from: home Anticipated d/c is to: home Anticipated d/c date is: likely tomorrow   Subjective and Interval History:  Pt reported back pain better.  Pt was cleared for discharge by oncology today with outpatient imaging and f/u, however, pt said she couldn't get into her apartment and has to wait for tomorrow to go home.   Objective: Vitals:   07/24/23 0900 07/24/23 1029 07/24/23 1031 07/24/23 1949  BP: 98/64   118/69  Pulse: 100 100 99 (!) 104  Resp: 20 20 18 20   Temp: 98.3 F (36.8 C)   98.2 F (36.8 C)  TempSrc: Oral   Oral  SpO2: 97% 90% 97% 91%  Weight:      Height:        Intake/Output Summary (Last 24 hours) at 07/24/2023 2248 Last data filed at 07/24/2023 1831 Gross per 24 hour  Intake 361.25 ml  Output --  Net 361.25 ml   Filed Weights   07/23/23 0837  Weight: 55.3 kg    Examination:   Constitutional: NAD, AAOx3 HEENT: conjunctivae and lids normal, EOMI CV: No cyanosis.   RESP: normal respiratory effort, on RA Neuro: II - XII grossly intact.   Psych: Normal mood and affect.  Appropriate judgement and reason   Data Reviewed: I have personally reviewed labs and imaging studies  Time spent: 50 minutes  Darlin Priestly, MD [facility] If 7PM-7AM, please contact night-coverage 07/24/2023, 10:48 PM

## 2023-07-25 DIAGNOSIS — C50912 Malignant neoplasm of unspecified site of left female breast: Secondary | ICD-10-CM | POA: Diagnosis not present

## 2023-07-25 DIAGNOSIS — C7951 Secondary malignant neoplasm of bone: Secondary | ICD-10-CM | POA: Diagnosis not present

## 2023-07-25 LAB — BASIC METABOLIC PANEL
Anion gap: 12 (ref 5–15)
BUN: 33 mg/dL — ABNORMAL HIGH (ref 8–23)
CO2: 21 mmol/L — ABNORMAL LOW (ref 22–32)
Calcium: 9.2 mg/dL (ref 8.9–10.3)
Chloride: 102 mmol/L (ref 98–111)
Creatinine, Ser: 1.26 mg/dL — ABNORMAL HIGH (ref 0.44–1.00)
GFR, Estimated: 48 mL/min — ABNORMAL LOW (ref >60)
Glucose, Bld: 100 mg/dL — ABNORMAL HIGH (ref 70–99)
Potassium: 3.8 mmol/L (ref 3.5–5.1)
Sodium: 135 mmol/L (ref 135–145)

## 2023-07-25 LAB — CANCER ANTIGEN 27.29: CA 27.29: 119.3 U/mL — ABNORMAL HIGH (ref 0.0–38.6)

## 2023-07-25 LAB — MAGNESIUM: Magnesium: 2.4 mg/dL (ref 1.7–2.4)

## 2023-07-25 MED ORDER — APIXABAN 5 MG PO TABS: ORAL_TABLET | ORAL | 0 refills | Status: DC

## 2023-07-25 NOTE — Discharge Summary (Signed)
Physician Discharge Summary  Tenkiller ZOX:[id number] DOB: June 27, 1961 DOA: 07/23/2023  PCP: Patient, No Pcp Per  Admit date: 07/23/2023 Discharge date: 07/26/23  Admitted From: home  Disposition:  home   Recommendations for Outpatient Follow-up:  Follow up with PCP in 1-2 weeks F/u w/ onco, Dr. Orlie Dakin, w/in 1 week   Home Health:  Equipment/Devices:  Discharge Condition: stable  CODE STATUS: DNR Diet recommendation: Regular   Brief/Interim Summary: HPI was taken from Dr. Alvester Morin: Reyhana Jones is a 62 y.o. female with medical history significant of alcohol and tobacco use presenting with back pain and metastatic breast cancer.  Patient reports noticing large lump on her left breast roughly 5 to 6 months ago.  Patient reports extensive prior family history of cancer including lung and the breast.  States she did not follow-up because she was afraid of what it might be.  Baseline 1/2 pack/day smoker.  Also drinks roughly 2 to 340 ounce beers every other day at minimum.  Unclear about illicit drug use.  Positive weight loss over multiple months.  Has had worsening back pain especially with movement over the past 1 to 2 weeks.  No fevers or chills.  No nausea or vomiting.  No shortness of breath.  Minimal chest pain.  Has not had PCP follow-up. Presented to the ER afebrile, heart rate 100s, BP stable.  Satting well on room air.  White count 13.3, hemoglobin 12.4, platelets 157, creatinine 0.53, AST 300, ALT 57, T. bili 5.6.  Alk phos 173.  CT angio of the chest negative for PE but does show a large infiltrative left-sided breast mass with skin thickening and edema.  Extensive soft tissue in the axilla which could represent abnormal nodes.  Extensive abnormal lymph nodes in the chest and abdomen as well as lung.  Liver lesions concerning for metastatic disease.  Positive portal vein thrombosis.  Some concern for cirrhosis with a Pado megaly.  Positive bony lytic lesions.  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  CT of the T and L-spine showing lytic lesions at L1, T3, T8, T9, T11, T12.  Per Dr. Larinda Buttery, case discussed with Dr. Orlie Dakin who will formally consult on patient.  Palliative care also recommended.  Discharge Diagnoses:  Principal Problem:   Breast cancer metastasized to bone [facility]) Active Problems:   Portal vein thrombosis   Alcohol abuse   Tobacco abuse   Vulvar mass   Hyperbilirubinemia   Palliative care encounter   Metastatic malignant neoplasm (HCC)  Breast cancer metastasized to bone: noticed breast mass without follow-up over multiple months with new onset back pain. CT imaging concerning for primary breast cancer with significant metastasis to bone as well as the liver +large infiltrative left-sided breast mass with skin thickening and edema. Lytic lesions noted on L1,  T3, T8, T9, T11, and T12. Will need mammogram, ultrasound, and biopsy as outpatient, oncology to arrange. Oxy prn    AKI: unknown baseline Cr/GFR. Cr is trending down from day prior. Avoid nephrotoxic meds.    Hyperbilirubinemia: likely secondary to metastatic breast cancer & alcohol abuse. 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 Needs outpatient gyn f/u   Tobacco abuse: 1/2 pack/day smoker. Smoking cessation counseling x 5 mins   Alcohol abuse: regular intake of 2-3+40 ounce beers at least every other day. Continue on folic acid and thiamine   Portal vein thrombosis: CT imaging concerning for portal vein thrombosis in the setting of metastatic  breast cancer. Started on lovenox inpatient but was d/c home on eliquis   Discharge Instructions  Discharge Instructions     Diet general   Complete by: As directed    Discharge instructions   Complete by: As directed    F/u w/ PCP in 1-2 weeks. F/u w/ onco/cancer doctor, Dr. Orlie Dakin, within 1 week.   Increase activity slowly   Complete by: As directed       Allergies as of 07/25/2023   No  Known Allergies      Medication List     TAKE these medications    apixaban 5 MG Tabs tablet Commonly known as: Eliquis Take 2 tablets (10mg ) twice daily for 7 days, then 1 tablet (5mg ) twice daily   feeding supplement Liqd Take 237 mLs by mouth 2 (two) times daily between meals.   folic acid 1 MG tablet Commonly known as: FOLVITE Take 1 tablet (1 mg total) by mouth daily.   multivitamin with minerals Tabs tablet Take 1 tablet by mouth daily.   oxyCODONE 5 MG immediate release tablet Commonly known as: Oxy IR/ROXICODONE Take 1 tablet (5 mg total) by mouth every 6 (six) hours as needed for moderate pain (pain score 4-6) or severe pain (pain score 7-10).   thiamine 100 MG tablet Commonly known as: Vitamin B-1 Take 1 tablet (100 mg total) by mouth daily.        Follow-up Information     Jeralyn Ruths, MD. Call.   Specialty: Oncology Why: Office will call you, if you havent received a call within 3 buisness days, please call the office Contact information: [address] [place] [address] [phone]                No Known Allergies  Consultations: Onco Palliative care    Procedures/Studies: CT T-SPINE NO CHARGE  Result Date: 07/23/2023 CLINICAL DATA:  Mid back pain, pain to left lateral chest wall EXAM: CT THORACIC AND LUMBAR SPINE WITHOUT CONTRAST TECHNIQUE: Multidetector CT imaging of the thoracic and lumbar spine was performed without contrast. Multiplanar CT image reconstructions were also generated. RADIATION DOSE REDUCTION: This exam was performed according to the departmental dose-optimization program which includes automated exposure control, adjustment of the mA and/or kV according to patient size and/or use of iterative reconstruction technique. COMPARISON:  None Available. FINDINGS: CT THORACIC SPINE FINDINGS Alignment: No listhesis. Preservation of the normal thoracic kyphosis. Levocurvature of the upper to midthoracic spine and  dextrocurvature of the thoracolumbar junction. Vertebrae: Lytic lesions are suspected in the right aspect of T3 (series 3, image 22), the right aspect of T8 and T9 (series 3, image 67 and 79), and the left aspects of T11 and T12 (series 3, images 98 and 112); these lesions are quite subtle on CT, with the exception of T12 lesion. Vertebral body heights are preserved. No evidence of acute fracture. Paraspinal and other soft tissues: Please see same-day CT chest. Disc levels: Mild degenerative changes, without significant spinal canal stenosis. CT LUMBAR SPINE FINDINGS Segmentation: 5 lumbar type vertebral bodies. Alignment: Mild levocurvature.  No significant listhesis. Vertebrae: Lytic lesion in the left aspect of L1, with expansile soft tissue component (series 6, image 18). There is likely epidural extension along the right aspect of the tumor (series 6, image 17). No other suspected lesions in the lumbar spine. Vertebral body heights are preserved. No acute fracture. Paraspinal and other soft tissues: Please see same-day CT abdomen pelvis. Disc levels: Disc heights are preserved. No significant  spinal canal stenosis or neural foraminal narrowing. IMPRESSION: 1. Lytic lesion in the left aspect of L1, with expansile soft tissue component and likely epidural extension along the right aspect of the tumor. Additional lytic lesions are suspected in T3, T8, T9, T11, and T12. MRI of the thoracic and lumbar spine with and without contrast is recommended for further evaluation. 2. No acute fracture or traumatic listhesis in the thoracic or lumbar spine. 3. For soft tissue findings, please see same-day CT chest abdomen pelvis. Electronically Signed   By: Wiliam Ke M.D.   On: 07/23/2023 12:53   CT L-SPINE NO CHARGE  Result Date: 07/23/2023 CLINICAL DATA:  Mid back pain, pain to left lateral chest wall EXAM: CT THORACIC AND LUMBAR SPINE WITHOUT CONTRAST TECHNIQUE: Multidetector CT imaging of the thoracic and lumbar  spine was performed without contrast. Multiplanar CT image reconstructions were also generated. RADIATION DOSE REDUCTION: This exam was performed according to the departmental dose-optimization program which includes automated exposure control, adjustment of the mA and/or kV according to patient size and/or use of iterative reconstruction technique. COMPARISON:  None Available. FINDINGS: CT THORACIC SPINE FINDINGS Alignment: No listhesis. Preservation of the normal thoracic kyphosis. Levocurvature of the upper to midthoracic spine and dextrocurvature of the thoracolumbar junction. Vertebrae: Lytic lesions are suspected in the right aspect of T3 (series 3, image 22), the right aspect of T8 and T9 (series 3, image 67 and 79), and the left aspects of T11 and T12 (series 3, images 98 and 112); these lesions are quite subtle on CT, with the exception of T12 lesion. Vertebral body heights are preserved. No evidence of acute fracture. Paraspinal and other soft tissues: Please see same-day CT chest. Disc levels: Mild degenerative changes, without significant spinal canal stenosis. CT LUMBAR SPINE FINDINGS Segmentation: 5 lumbar type vertebral bodies. Alignment: Mild levocurvature.  No significant listhesis. Vertebrae: Lytic lesion in the left aspect of L1, with expansile soft tissue component (series 6, image 18). There is likely epidural extension along the right aspect of the tumor (series 6, image 17). No other suspected lesions in the lumbar spine. Vertebral body heights are preserved. No acute fracture. Paraspinal and other soft tissues: Please see same-day CT abdomen pelvis. Disc levels: Disc heights are preserved. No significant spinal canal stenosis or neural foraminal narrowing. IMPRESSION: 1. Lytic lesion in the left aspect of L1, with expansile soft tissue component and likely epidural extension along the right aspect of the tumor. Additional lytic lesions are suspected in T3, T8, T9, T11, and T12. MRI of the  thoracic and lumbar spine with and without contrast is recommended for further evaluation. 2. No acute fracture or traumatic listhesis in the thoracic or lumbar spine. 3. For soft tissue findings, please see same-day CT chest abdomen pelvis. Electronically Signed   By: Wiliam Ke M.D.   On: 07/23/2023 12:53   CT Angio Chest PE W/Cm &/Or Wo Cm  Result Date: 07/23/2023 CLINICAL DATA:  Mid back pain that began 2 weeks ago. * Tracking Code: BO * EXAM: CT ANGIOGRAPHY CHEST CT ABDOMEN AND PELVIS WITH CONTRAST TECHNIQUE: Multidetector CT imaging of the chest was performed using the standard protocol during bolus administration of intravenous contrast. Multiplanar CT image reconstructions and MIPs were obtained to evaluate the vascular anatomy. Multidetector CT imaging of the abdomen and pelvis was performed using the standard protocol during bolus administration of intravenous contrast. RADIATION DOSE REDUCTION: This exam was performed according to the departmental dose-optimization program which includes automated exposure control, adjustment  of the mA and/or kV according to patient size and/or use of iterative reconstruction technique. CONTRAST:  OMNIPAQUE IOHEXOL 350 MG/ML SOLN COMPARISON:  None Available. FINDINGS: CTA CHEST FINDINGS Cardiovascular: Heart is slightly enlarged. No significant pericardial effusion. Coronary artery calcifications are seen. The thoracic aorta has a normal course and caliber with some mild atherosclerotic plaque. There is some enlargement of the main pulmonary arteries. Please correlate for pulmonary artery hypertension. No segmental or larger pulmonary embolism identified. There is some breathing motion which can limit evaluation of small and peripheral emboli. Mediastinum/Nodes: Normal caliber thoracic esophagus. Preserved thyroid gland. There is no specific abnormal lymph node enlargement identified in the right axillary region. There are some small bilateral hilar nodes.  Example left left hilum on series 2, image 63 measures 7 mm in short axis. Right hilar node on series 2, image 55 has short axis measurement of 8 mm. There is 1 truly enlarged left hilar node on series 2, image 58 measuring 2.2 1.5 cm. There are some small mediastinal nodes as well. Example subcarinal node has short axis on series 2, image 58 measuring 13 mm. There is fullness in the AP window with a thickness approaching 10 mm on series 2, image 43. Small nodes prevascular. There is also an abnormal left internal mammary chain node measuring 10 x 9 mm on series 2, image 36. There is extensive abnormal tissue, presumed nodes in the left axillary region with a left-sided large breast mass on series 2, image 75, asymmetric from the right side. Please correlate with clinical history. There is associated skin thickening and anasarca. Lungs/Pleura: No consolidation, pneumothorax. Trace pleural fluid. Basilar areas of bandlike opacity with ground-glass. Underlying centrilobular emphysematous changes diffusely. There is also presence of a left upper lobe nodule on series 4 image 48 measuring 10 by 9 mm. Additional small nodule left lower lobe on series 4, image 98. small amount of presumed debris along the trachea. Musculoskeletal: Please see separate dictation of spine CT examinations. There are some lytic lesions identified such as the right eighth rib on series 4, image 112. Few other areas suggested as well. Review of the MIP images confirms the above findings. CT ABDOMEN and PELVIS FINDINGS Hepatobiliary: There are numerous small low-attenuation lesions scattered throughout the liver worrisome for metastatic disease. There is a large geographic areas of low-density in the right hepatic lobe consistent with fatty infiltration. In this area there appears to be areas of portal vein branch thrombosis. Changes of potential pseudo cirrhosis. Gallbladder is nondilated. Pancreas: Unremarkable. No pancreatic ductal dilatation  or surrounding inflammatory changes. Spleen: Normal in size without focal abnormality. Adrenals/Urinary Tract: Adrenal glands are preserved. No enhancing renal mass or collecting system dilatation. Preserved contours of the urinary bladder. Stomach/Bowel: No oral contrast. The stomach is decompressed. The wall thickening along the pylorus could be related to level of distention. The small bowel is nondilated. Large bowel has a normal course and caliber with scattered stool and diverticulosis. Normal appendix in the right lower quadrant inferior to the cecum. Vascular/Lymphatic: Scattered vascular calcifications. Normal caliber aorta and IVC. There are several abnormal nodes identified in the upper abdomen. Example portacaval series 3, image 27 measures 2.3 1.4 cm. Gastrohepatic ligament series 3, image 22 measures 17 by 15 mm. Several in the porta hepatis, some Peri aortic nodes as well. Reproductive: Multiple calcified uterine fibroids. No separate adnexal mass. There is some nodular tissue in the area of the perineum on the left side along the vulva such as  best seen coronal series 6, image 49 measuring 2.1 cm. Please correlate with direct visualization. Other: Diffuse ascites.  Anasarca. Musculoskeletal: There is some faint lytic lesions along the spine and pelvis. Please see separate spinal imaging. Spine lesions include L1. Trace anterolisthesis of L5 on S1. Review of the MIP images confirms the above findings. Critical Value/emergent results were called by telephone at the time of interpretation on 07/23/2023 at 9:26 am to provider [facility] , who verbally acknowledged these results. IMPRESSION: Large infiltrative left-sided breast mass with skin thickening and edema. Extensive soft tissue in the axilla which could represent abnormal nodes and direct spread. In addition there are several abnormal lymph nodes in the chest and abdomen including lung hilum, mediastinum, internal mammary chain,  supraclavicular, retroperitoneum, gastrohepatic ligament, porta hepatis. Liver has a extensive low-attenuation lesions consistent with metastatic disease. In addition there is large geographic areas of fatty infiltration involving multiple segments with what appears to be areas of portal vein thrombosis in these locations. 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 Few lytic bone lesions identified along the spine and ribs. Separate subtle 2 cm proximally mass along the left side of the vulva. Please correlate with direct visualization. Colonic diverticulosis.  No bowel obstruction or free air. Trace pleural fluid. No segmental or larger pulmonary embolism. Electronically Signed   By: Karen Kays M.D.   On: 07/23/2023 12:29   CT ABDOMEN PELVIS W CONTRAST  Result Date: 07/23/2023 CLINICAL DATA:  Mid back pain that began 2 weeks ago. * Tracking Code: BO * EXAM: CT ANGIOGRAPHY CHEST CT ABDOMEN AND PELVIS WITH CONTRAST TECHNIQUE: Multidetector CT imaging of the chest was performed using the standard protocol during bolus administration of intravenous contrast. Multiplanar CT image reconstructions and MIPs were obtained to evaluate the vascular anatomy. Multidetector CT imaging of the abdomen and pelvis was performed using the standard protocol during bolus administration of intravenous contrast. RADIATION DOSE REDUCTION: This exam was performed according to the departmental dose-optimization program which includes automated exposure control, adjustment of the mA and/or kV according to patient size and/or use of iterative reconstruction technique. CONTRAST:  OMNIPAQUE IOHEXOL 350 MG/ML SOLN COMPARISON:  None Available. FINDINGS: CTA CHEST FINDINGS Cardiovascular: Heart is slightly enlarged. No significant pericardial effusion. Coronary artery calcifications are seen. The thoracic aorta has a normal course and caliber with some mild atherosclerotic plaque. There is some  enlargement of the main pulmonary arteries. Please correlate for pulmonary artery hypertension. No segmental or larger pulmonary embolism identified. There is some breathing motion which can limit evaluation of small and peripheral emboli. Mediastinum/Nodes: Normal caliber thoracic esophagus. Preserved thyroid gland. There is no specific abnormal lymph node enlargement identified in the right axillary region. There are some small bilateral hilar nodes. Example left left hilum on series 2, image 63 measures 7 mm in short axis. Right hilar node on series 2, image 55 has short axis measurement of 8 mm. There is 1 truly enlarged left hilar node on series 2, image 58 measuring 2.2 1.5 cm. There are some small mediastinal nodes as well. Example subcarinal node has short axis on series 2, image 58 measuring 13 mm. There is fullness in the AP window with a thickness approaching 10 mm on series 2, image 43. Small nodes prevascular. There is also an abnormal left internal mammary chain node measuring 10 x 9 mm on series 2, image 36. There is extensive abnormal tissue, presumed nodes in the left axillary region with a left-sided large  breast mass on series 2, image 75, asymmetric from the right side. Please correlate with clinical history. There is associated skin thickening and anasarca. Lungs/Pleura: No consolidation, pneumothorax. Trace pleural fluid. Basilar areas of bandlike opacity with ground-glass. Underlying centrilobular emphysematous changes diffusely. There is also presence of a left upper lobe nodule on series 4 image 48 measuring 10 by 9 mm. Additional small nodule left lower lobe on series 4, image 98. small amount of presumed debris along the trachea. Musculoskeletal: Please see separate dictation of spine CT examinations. There are some lytic lesions identified such as the right eighth rib on series 4, image 112. Few other areas suggested as well. Review of the MIP images confirms the above findings. CT  ABDOMEN and PELVIS FINDINGS Hepatobiliary: There are numerous small low-attenuation lesions scattered throughout the liver worrisome for metastatic disease. There is a large geographic areas of low-density in the right hepatic lobe consistent with fatty infiltration. In this area there appears to be areas of portal vein branch thrombosis. Changes of potential pseudo cirrhosis. Gallbladder is nondilated. Pancreas: Unremarkable. No pancreatic ductal dilatation or surrounding inflammatory changes. Spleen: Normal in size without focal abnormality. Adrenals/Urinary Tract: Adrenal glands are preserved. No enhancing renal mass or collecting system dilatation. Preserved contours of the urinary bladder. Stomach/Bowel: No oral contrast. The stomach is decompressed. The wall thickening along the pylorus could be related to level of distention. The small bowel is nondilated. Large bowel has a normal course and caliber with scattered stool and diverticulosis. Normal appendix in the right lower quadrant inferior to the cecum. Vascular/Lymphatic: Scattered vascular calcifications. Normal caliber aorta and IVC. There are several abnormal nodes identified in the upper abdomen. Example portacaval series 3, image 27 measures 2.3 1.4 cm. Gastrohepatic ligament series 3, image 22 measures 17 by 15 mm. Several in the porta hepatis, some Peri aortic nodes as well. Reproductive: Multiple calcified uterine fibroids. No separate adnexal mass. There is some nodular tissue in the area of the perineum on the left side along the vulva such as best seen coronal series 6, image 49 measuring 2.1 cm. Please correlate with direct visualization. Other: Diffuse ascites.  Anasarca. Musculoskeletal: There is some faint lytic lesions along the spine and pelvis. Please see separate spinal imaging. Spine lesions include L1. Trace anterolisthesis of L5 on S1. Review of the MIP images confirms the above findings. Critical Value/emergent results were called by  telephone at the time of interpretation on 07/23/2023 at 9:26 am to provider [facility] , who verbally acknowledged these results. IMPRESSION: Large infiltrative left-sided breast mass with skin thickening and edema. Extensive soft tissue in the axilla which could represent abnormal nodes and direct spread. In addition there are several abnormal lymph nodes in the chest and abdomen including lung hilum, mediastinum, internal mammary chain, supraclavicular, retroperitoneum, gastrohepatic ligament, porta hepatis. Liver has a extensive low-attenuation lesions consistent with metastatic disease. In addition there is large geographic areas of fatty infiltration involving multiple segments with what appears to be areas of portal vein thrombosis in these locations. 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 Few lytic bone lesions identified along the spine and ribs. Separate subtle 2 cm proximally mass along the left side of the vulva. Please correlate with direct visualization. Colonic diverticulosis.  No bowel obstruction or free air. Trace pleural fluid. No segmental or larger pulmonary embolism. Electronically Signed   By: Karen Kays M.D.   On: 07/23/2023 12:29   (Echo, Carotid, EGD, Colonoscopy, ERCP)  Subjective: pt c/o fatigue    Discharge Exam: Vitals:   07/25/23 0416 07/25/23 0743  BP: 108/67 121/80  Pulse:  (!) 109  Resp: 20 18  Temp: 98.8 F (37.1 C) 97.9 F (36.6 C)  SpO2: (!) 88% 99%   Vitals:   07/24/23 1031 07/24/23 1949 07/25/23 0416 07/25/23 0743  BP:  118/69 108/67 121/80  Pulse: 99 (!) 104  (!) 109  Resp: 18 20 20 18   Temp:  98.2 F (36.8 C) 98.8 F (37.1 C) 97.9 F (36.6 C)  TempSrc:  Oral    SpO2: 97% 91% (!) 88% 99%  Weight:      Height:        General: Pt is alert, awake, not in acute distress Cardiovascular: S1/S2 +, no rubs, no gallops Respiratory: CTA bilaterally, no wheezing, no rhonchi Abdominal: Soft, NT, ND, bowel  sounds + Extremities: no edema, no cyanosis    The results of significant diagnostics from this hospitalization (including imaging, microbiology, ancillary and laboratory) are listed below for reference.     Microbiology: No results found for this or any previous visit (from the past 240 hour(s)).   Labs: BNP (last 3 results) No results for input(s): "BNP" in the last 8760 hours. Basic Metabolic Panel: Recent Labs  Lab 07/23/23 0924 07/24/23 0450 07/24/23 1136 07/25/23 0419  NA 140 136  --  135  K 3.3* 3.9  --  3.8  CL 104 102  --  102  CO2 23 24  --  21*  GLUCOSE 79 78  --  100*  BUN 17 23  --  33*  CREATININE 0.53 1.12* 1.39* 1.26*  CALCIUM 10.0 9.5  --  9.2  MG  --   --   --  2.4   Liver Function Tests: Recent Labs  Lab 07/23/23 0924 07/24/23 0450  AST 300* 279*  ALT 57* 51*  ALKPHOS 173* 153*  BILITOT 5.6* 4.6*  PROT 6.9 6.7  ALBUMIN 2.6* 2.5*   Recent Labs  Lab 07/23/23 0924  LIPASE 19   No results for input(s): "AMMONIA" in the last 168 hours. CBC: Recent Labs  Lab 07/23/23 0924 07/24/23 0450  WBC 13.3* 11.7*  NEUTROABS 10.5*  --   HGB 12.4 11.0*  HCT 38.6 34.1*  MCV 90.8 91.4  PLT 157 147*   Cardiac Enzymes: No results for input(s): "CKTOTAL", "CKMB", "CKMBINDEX", "TROPONINI" in the last 168 hours. BNP: Invalid input(s): "POCBNP" CBG: No results for input(s): "GLUCAP" in the last 168 hours. D-Dimer No results for input(s): "DDIMER" in the last 72 hours. Hgb A1c No results for input(s): "HGBA1C" in the last 72 hours. Lipid Profile No results for input(s): "CHOL", "HDL", "LDLCALC", "TRIG", "CHOLHDL", "LDLDIRECT" in the last 72 hours. Thyroid function studies No results for input(s): "TSH", "T4TOTAL", "T3FREE", "THYROIDAB" in the last 72 hours.  Invalid input(s): "FREET3" Anemia work up No results for input(s): "VITAMINB12", "FOLATE", "FERRITIN", "TIBC", "IRON", "RETICCTPCT" in the last 72 hours. Urinalysis No results found for:  "COLORURINE", "APPEARANCEUR", "LABSPEC", "PHURINE", "GLUCOSEU", "HGBUR", "BILIRUBINUR", "KETONESUR", "PROTEINUR", "UROBILINOGEN", "NITRITE", "LEUKOCYTESUR" Sepsis Labs Recent Labs  Lab 07/23/23 0924 07/24/23 0450  WBC 13.3* 11.7*   Microbiology No results found for this or any previous visit (from the past 240 hour(s)).   Time coordinating discharge: Over 30 minutes  SIGNED:   Charise Killian, MD  [facility] 07/25/2023, 3:28 PM Pager   If 7PM-7AM, please contact night-coverage

## 2023-07-26 ENCOUNTER — Other Ambulatory Visit (HOSPITAL_COMMUNITY): Payer: Self-pay

## 2023-07-26 ENCOUNTER — Telehealth: Payer: Self-pay | Admitting: *Deleted

## 2023-07-26 ENCOUNTER — Encounter: Payer: Self-pay | Admitting: *Deleted

## 2023-07-26 DIAGNOSIS — N6320 Unspecified lump in the left breast, unspecified quadrant: Secondary | ICD-10-CM

## 2023-07-26 NOTE — TOC Benefit Eligibility Note (Signed)
Patient Product/process development scientist completed.    The patient is insured through [facility]. Patient has [employer], may use a copay card, and/or apply for patient assistance if available.    Ran test claim for Eliquis Starter Pack and the current 30 day co-pay is $682.82 due to a $6500 deductilbe.   This test claim was processed through [facility]- copay amounts may vary at other pharmacies due to pharmacy/plan contracts, or as the patient moves through the different stages of their insurance plan.     Roland Earl, CPHT Pharmacy Technician III Certified Patient Advocate [facility] Patient Advocate Team Direct Number: [phone]  Fax: [phone]

## 2023-07-26 NOTE — Plan of Care (Signed)
  Problem: Education: Goal: Knowledge of General Education information will improve Description: Including pain rating scale, medication(s)/side effects and non-pharmacologic comfort measures 07/26/2023 0517 by Garrison Columbus, RN Outcome: Progressing 07/26/2023 0517 by Garrison Columbus, RN Reactivated   Problem: Health Behavior/Discharge Planning: Goal: Ability to manage health-related needs will improve 07/26/2023 0517 by Garrison Columbus, RN Outcome: Progressing 07/26/2023 0517 by Garrison Columbus, RN Reactivated   Problem: Clinical Measurements: Goal: Ability to maintain clinical measurements within normal limits will improve 07/26/2023 0517 by Garrison Columbus, RN Outcome: Progressing 07/26/2023 0517 by Garrison Columbus, RN Reactivated Goal: Will remain free from infection 07/26/2023 0517 by Garrison Columbus, RN Outcome: Progressing 07/26/2023 0517 by Garrison Columbus, RN Reactivated Goal: Diagnostic test results will improve 07/26/2023 0517 by Garrison Columbus, RN Outcome: Progressing 07/26/2023 0517 by Garrison Columbus, RN Reactivated Goal: Respiratory complications will improve 07/26/2023 0517 by Garrison Columbus, RN Outcome: Progressing 07/26/2023 0517 by Garrison Columbus, RN Reactivated Goal: Cardiovascular complication will be avoided 07/26/2023 0517 by Garrison Columbus, RN Outcome: Progressing 07/26/2023 0517 by Garrison Columbus, RN Reactivated   Problem: Activity: Goal: Risk for activity intolerance will decrease 07/26/2023 0517 by Garrison Columbus, RN Outcome: Progressing 07/26/2023 0517 by Garrison Columbus, RN Reactivated   Problem: Nutrition: Goal: Adequate nutrition will be maintained 07/26/2023 0517 by Garrison Columbus, RN Outcome: Progressing 07/26/2023 0517 by Garrison Columbus, RN Reactivated   Problem: Coping: Goal: Level of anxiety will decrease 07/26/2023 0517 by Garrison Columbus, RN Outcome: Progressing 07/26/2023 0517 by Garrison Columbus, RN Reactivated   Problem: Elimination: Goal: Will not experience complications related to bowel motility 07/26/2023 0517 by Garrison Columbus, RN Outcome: Progressing 07/26/2023 0517 by Garrison Columbus, RN Reactivated Goal: Will not experience complications related to urinary retention 07/26/2023 0517 by Garrison Columbus, RN Outcome: Progressing 07/26/2023 0517 by Garrison Columbus, RN Reactivated   Problem: Pain Management: Goal: General experience of comfort will improve 07/26/2023 0517 by Garrison Columbus, RN Outcome: Progressing 07/26/2023 0517 by Garrison Columbus, RN Reactivated   Problem: Safety: Goal: Ability to remain free from injury will improve 07/26/2023 0517 by Garrison Columbus, RN Outcome: Progressing 07/26/2023 0517 by Garrison Columbus, RN Reactivated   Problem: Skin Integrity: Goal: Risk for impaired skin integrity will decrease 07/26/2023 0517 by Garrison Columbus, RN Outcome: Progressing 07/26/2023 0517 by Garrison Columbus, RN Reactivated

## 2023-07-26 NOTE — Telephone Encounter (Signed)
Patient has be referred to Home health and does not have a PCP . She state that patient  will be seeing Dr Orlie Dakin though she does not have any appts scheduled at this time (I do see that you saw her on consult in hospital 07/23/23) Asking if Dr Orlie Dakin will sign Home health orders.  ASSESSMENT: Large left breast mass with metastatic lesions.   PLAN:     Large left breast mass with metastatic lesions: Imaging and physical exam consistent with left breast cancer metastasized at least bone and liver.  Patient will require mammogram, ultrasound, and biopsy which can be accomplished as an outpatient.  She will likely need a PET scan and MRI of the brain to complete the staging workup.  CA 27-29 has been ordered for completeness.  Will arrange follow-up in the cancer center after biopsy is completed. Pain: Patient reports this is significantly improved since admission. Hyperbilirubinemia/liver dysfunction: Likely secondary to metastatic disease.  Monitor. Renal insufficiency: Mild.  Patient's most recent creatinine is 1.39. Leukocytosis: Likely reactive, monitor. Anemia: Patient's most recent hemoglobin is 11.0, monitor. Social situation: Patient reports she has minimal social support for help.  Appreciate palliative care input.  Will also consult social work upon discharge.   Appreciate consult, will follow.         Jeralyn Ruths, MD   07/24/2023 2:50 PM

## 2023-07-26 NOTE — Telephone Encounter (Signed)
Call returned to [facility] and left message on her voice mail that doctor agrees to sign orders for [facility]

## 2023-07-27 ENCOUNTER — Telehealth: Payer: Self-pay | Admitting: Oncology

## 2023-07-27 NOTE — Telephone Encounter (Signed)
Attempt made to reach patient to schedule f/u appointment with Dr. Orlie Dakin. Mobile = "call cannot be completed at this time". Home=friend's number, no answer or option to leave a message.

## 2023-07-30 ENCOUNTER — Encounter: Payer: Self-pay | Admitting: *Deleted

## 2023-07-30 NOTE — Progress Notes (Signed)
Attempted to call Ms. Carrie Jones, number doesn't go through.   Called her friend listed as her contact and she doesn't have her number and has not heard from her.    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

## 2023-07-31 ENCOUNTER — Emergency Department: Payer: 59

## 2023-07-31 ENCOUNTER — Other Ambulatory Visit: Payer: Self-pay

## 2023-07-31 ENCOUNTER — Inpatient Hospital Stay
Admission: EM | Admit: 2023-07-31 | Discharge: 2023-08-26 | DRG: 871 | Disposition: E | Payer: 59 | Attending: Student in an Organized Health Care Education/Training Program | Admitting: Student in an Organized Health Care Education/Training Program

## 2023-07-31 DIAGNOSIS — C50919 Malignant neoplasm of unspecified site of unspecified female breast: Secondary | ICD-10-CM | POA: Diagnosis present

## 2023-07-31 DIAGNOSIS — J9601 Acute respiratory failure with hypoxia: Secondary | ICD-10-CM | POA: Diagnosis present

## 2023-07-31 DIAGNOSIS — I81 Portal vein thrombosis: Secondary | ICD-10-CM | POA: Diagnosis present

## 2023-07-31 DIAGNOSIS — N3000 Acute cystitis without hematuria: Secondary | ICD-10-CM | POA: Diagnosis not present

## 2023-07-31 DIAGNOSIS — D72829 Elevated white blood cell count, unspecified: Secondary | ICD-10-CM | POA: Diagnosis present

## 2023-07-31 DIAGNOSIS — E872 Acidosis, unspecified: Secondary | ICD-10-CM | POA: Diagnosis present

## 2023-07-31 DIAGNOSIS — F1010 Alcohol abuse, uncomplicated: Secondary | ICD-10-CM | POA: Diagnosis present

## 2023-07-31 DIAGNOSIS — C7951 Secondary malignant neoplasm of bone: Secondary | ICD-10-CM | POA: Diagnosis present

## 2023-07-31 DIAGNOSIS — N9089 Other specified noninflammatory disorders of vulva and perineum: Secondary | ICD-10-CM

## 2023-07-31 DIAGNOSIS — F17210 Nicotine dependence, cigarettes, uncomplicated: Secondary | ICD-10-CM | POA: Diagnosis present

## 2023-07-31 DIAGNOSIS — R7402 Elevation of levels of lactic acid dehydrogenase (LDH): Secondary | ICD-10-CM | POA: Diagnosis present

## 2023-07-31 DIAGNOSIS — R6521 Severe sepsis with septic shock: Secondary | ICD-10-CM | POA: Diagnosis present

## 2023-07-31 DIAGNOSIS — E162 Hypoglycemia, unspecified: Secondary | ICD-10-CM | POA: Diagnosis present

## 2023-07-31 DIAGNOSIS — J439 Emphysema, unspecified: Secondary | ICD-10-CM | POA: Diagnosis present

## 2023-07-31 DIAGNOSIS — Z66 Do not resuscitate: Secondary | ICD-10-CM | POA: Diagnosis present

## 2023-07-31 DIAGNOSIS — A419 Sepsis, unspecified organism: Principal | ICD-10-CM | POA: Diagnosis present

## 2023-07-31 DIAGNOSIS — M6282 Rhabdomyolysis: Secondary | ICD-10-CM | POA: Diagnosis present

## 2023-07-31 DIAGNOSIS — G9341 Metabolic encephalopathy: Secondary | ICD-10-CM | POA: Diagnosis present

## 2023-07-31 DIAGNOSIS — C787 Secondary malignant neoplasm of liver and intrahepatic bile duct: Secondary | ICD-10-CM | POA: Diagnosis present

## 2023-07-31 DIAGNOSIS — N3001 Acute cystitis with hematuria: Secondary | ICD-10-CM

## 2023-07-31 DIAGNOSIS — D696 Thrombocytopenia, unspecified: Secondary | ICD-10-CM | POA: Diagnosis present

## 2023-07-31 DIAGNOSIS — R229 Localized swelling, mass and lump, unspecified: Secondary | ICD-10-CM | POA: Diagnosis present

## 2023-07-31 DIAGNOSIS — Z720 Tobacco use: Secondary | ICD-10-CM | POA: Diagnosis present

## 2023-07-31 DIAGNOSIS — N179 Acute kidney failure, unspecified: Secondary | ICD-10-CM | POA: Diagnosis present

## 2023-07-31 DIAGNOSIS — N390 Urinary tract infection, site not specified: Secondary | ICD-10-CM | POA: Diagnosis present

## 2023-07-31 DIAGNOSIS — R7989 Other specified abnormal findings of blood chemistry: Secondary | ICD-10-CM | POA: Diagnosis present

## 2023-07-31 DIAGNOSIS — C50912 Malignant neoplasm of unspecified site of left female breast: Secondary | ICD-10-CM | POA: Diagnosis present

## 2023-07-31 DIAGNOSIS — Z515 Encounter for palliative care: Secondary | ICD-10-CM

## 2023-07-31 DIAGNOSIS — Z1152 Encounter for screening for COVID-19: Secondary | ICD-10-CM | POA: Diagnosis not present

## 2023-07-31 DIAGNOSIS — C7981 Secondary malignant neoplasm of breast: Secondary | ICD-10-CM | POA: Diagnosis present

## 2023-07-31 DIAGNOSIS — E860 Dehydration: Secondary | ICD-10-CM | POA: Diagnosis present

## 2023-07-31 DIAGNOSIS — Z7901 Long term (current) use of anticoagulants: Secondary | ICD-10-CM

## 2023-07-31 LAB — CBC WITH DIFFERENTIAL/PLATELET
Abs Immature Granulocytes: 0.13 10*3/uL — ABNORMAL HIGH (ref 0.00–0.07)
Basophils Absolute: 0.1 10*3/uL (ref 0.0–0.1)
Basophils Relative: 0 %
Eosinophils Absolute: 0 10*3/uL (ref 0.0–0.5)
Eosinophils Relative: 0 %
HCT: 34.8 % — ABNORMAL LOW (ref 36.0–46.0)
Hemoglobin: 11.1 g/dL — ABNORMAL LOW (ref 12.0–15.0)
Immature Granulocytes: 1 %
Lymphocytes Relative: 6 %
Lymphs Abs: 0.8 10*3/uL (ref 0.7–4.0)
MCH: 30.2 pg (ref 26.0–34.0)
MCHC: 31.9 g/dL (ref 30.0–36.0)
MCV: 94.6 fL (ref 80.0–100.0)
Monocytes Absolute: 1 10*3/uL (ref 0.1–1.0)
Monocytes Relative: 7 %
Neutro Abs: 13 10*3/uL — ABNORMAL HIGH (ref 1.7–7.7)
Neutrophils Relative %: 86 %
Platelets: 89 10*3/uL — ABNORMAL LOW (ref 150–400)
RBC: 3.68 MIL/uL — ABNORMAL LOW (ref 3.87–5.11)
RDW: 22.8 % — ABNORMAL HIGH (ref 11.5–15.5)
WBC: 15 10*3/uL — ABNORMAL HIGH (ref 4.0–10.5)
nRBC: 37.8 % — ABNORMAL HIGH (ref 0.0–0.2)

## 2023-07-31 LAB — COMPREHENSIVE METABOLIC PANEL
ALT: 62 U/L — ABNORMAL HIGH (ref 0–44)
AST: 488 U/L — ABNORMAL HIGH (ref 15–41)
Albumin: 2.1 g/dL — ABNORMAL LOW (ref 3.5–5.0)
Alkaline Phosphatase: 172 U/L — ABNORMAL HIGH (ref 38–126)
Anion gap: 18 — ABNORMAL HIGH (ref 5–15)
BUN: 66 mg/dL — ABNORMAL HIGH (ref 8–23)
CO2: 17 mmol/L — ABNORMAL LOW (ref 22–32)
Calcium: 10.8 mg/dL — ABNORMAL HIGH (ref 8.9–10.3)
Chloride: 112 mmol/L — ABNORMAL HIGH (ref 98–111)
Creatinine, Ser: 1.33 mg/dL — ABNORMAL HIGH (ref 0.44–1.00)
GFR, Estimated: 45 mL/min — ABNORMAL LOW (ref >60)
Glucose, Bld: 58 mg/dL — ABNORMAL LOW (ref 70–99)
Potassium: 3.8 mmol/L (ref 3.5–5.1)
Sodium: 147 mmol/L — ABNORMAL HIGH (ref 135–145)
Total Bilirubin: 11.9 mg/dL — ABNORMAL HIGH (ref <1.2)
Total Protein: 6.9 g/dL (ref 6.5–8.1)

## 2023-07-31 LAB — LIPASE, BLOOD: Lipase: 16 U/L (ref 11–51)

## 2023-07-31 LAB — TECHNOLOGIST SMEAR REVIEW: Plt Morphology: NORMAL

## 2023-07-31 LAB — URINALYSIS, W/ REFLEX TO CULTURE (INFECTION SUSPECTED)
Glucose, UA: NEGATIVE mg/dL
Hgb urine dipstick: NEGATIVE
Ketones, ur: 5 mg/dL — AB
Leukocytes,Ua: NEGATIVE
Nitrite: NEGATIVE
Protein, ur: NEGATIVE mg/dL
Specific Gravity, Urine: 1.017 (ref 1.005–1.030)
pH: 5 (ref 5.0–8.0)

## 2023-07-31 LAB — CBG MONITORING, ED
Glucose-Capillary: 178 mg/dL — ABNORMAL HIGH (ref 70–99)
Glucose-Capillary: 54 mg/dL — ABNORMAL LOW (ref 70–99)

## 2023-07-31 LAB — LACTIC ACID, PLASMA
Lactic Acid, Venous: 5.8 mmol/L (ref 0.5–1.9)
Lactic Acid, Venous: 7 mmol/L (ref 0.5–1.9)
Lactic Acid, Venous: 7.7 mmol/L (ref 0.5–1.9)

## 2023-07-31 LAB — RESP PANEL BY RT-PCR (RSV, FLU A&B, COVID)  RVPGX2
Influenza A by PCR: NEGATIVE
Influenza B by PCR: NEGATIVE
Resp Syncytial Virus by PCR: NEGATIVE
SARS Coronavirus 2 by RT PCR: NEGATIVE

## 2023-07-31 LAB — APTT: aPTT: 42 s — ABNORMAL HIGH (ref 24–36)

## 2023-07-31 LAB — CK: Total CK: 1131 U/L — ABNORMAL HIGH (ref 38–234)

## 2023-07-31 LAB — LACTATE DEHYDROGENASE: LDH: 969 U/L — ABNORMAL HIGH (ref 98–192)

## 2023-07-31 LAB — PROTIME-INR
INR: 1.5 — ABNORMAL HIGH (ref 0.8–1.2)
Prothrombin Time: 18.5 s — ABNORMAL HIGH (ref 11.4–15.2)

## 2023-07-31 LAB — PROCALCITONIN: Procalcitonin: 13.21 ng/mL

## 2023-07-31 LAB — MAGNESIUM: Magnesium: 2.8 mg/dL — ABNORMAL HIGH (ref 1.7–2.4)

## 2023-07-31 LAB — PHOSPHORUS: Phosphorus: 4.8 mg/dL — ABNORMAL HIGH (ref 2.5–4.6)

## 2023-07-31 LAB — AMMONIA: Ammonia: 26 umol/L (ref 9–35)

## 2023-07-31 MED ORDER — DEXTROSE 50 % IV SOLN
1.0000 | Freq: Once | INTRAVENOUS | Status: AC
  Administered 2023-07-31: 50 mL via INTRAVENOUS
  Filled 2023-07-31: qty 50

## 2023-07-31 MED ORDER — VANCOMYCIN HCL IN DEXTROSE 1-5 GM/200ML-% IV SOLN
1000.0000 mg | Freq: Once | INTRAVENOUS | Status: AC
  Administered 2023-07-31: 1000 mg via INTRAVENOUS
  Filled 2023-07-31: qty 200

## 2023-07-31 MED ORDER — ADULT MULTIVITAMIN W/MINERALS CH: 1.0000 | ORAL_TABLET | Freq: Every day | ORAL | Status: DC

## 2023-07-31 MED ORDER — LACTATED RINGERS IV BOLUS: 500.0000 mL | Freq: Once | INTRAVENOUS | Status: DC

## 2023-07-31 MED ORDER — METRONIDAZOLE 500 MG/100ML IV SOLN
500.0000 mg | Freq: Two times a day (BID) | INTRAVENOUS | Status: DC
  Administered 2023-08-01 (×2): 500 mg via INTRAVENOUS
  Filled 2023-07-31 (×2): qty 100

## 2023-07-31 MED ORDER — LACTATED RINGERS IV SOLN: INTRAVENOUS | Status: DC

## 2023-07-31 MED ORDER — METRONIDAZOLE 500 MG/100ML IV SOLN
500.0000 mg | Freq: Once | INTRAVENOUS | Status: AC
  Administered 2023-07-31: 500 mg via INTRAVENOUS
  Filled 2023-07-31: qty 100

## 2023-07-31 MED ORDER — NICOTINE 21 MG/24HR TD PT24
21.0000 mg | MEDICATED_PATCH | Freq: Every day | TRANSDERMAL | Status: DC
  Administered 2023-07-31 – 2023-08-01 (×2): 21 mg via TRANSDERMAL
  Filled 2023-07-31 (×2): qty 1

## 2023-07-31 MED ORDER — LORAZEPAM 1 MG PO TABS
1.0000 mg | ORAL_TABLET | ORAL | Status: DC | PRN
  Filled 2023-07-31: qty 2

## 2023-07-31 MED ORDER — ALBUTEROL SULFATE (2.5 MG/3ML) 0.083% IN NEBU: 2.5000 mg | INHALATION_SOLUTION | RESPIRATORY_TRACT | Status: DC | PRN

## 2023-07-31 MED ORDER — OXYCODONE HCL 5 MG PO TABS: 5.0000 mg | ORAL_TABLET | Freq: Four times a day (QID) | ORAL | Status: DC | PRN

## 2023-07-31 MED ORDER — DEXTROSE-SODIUM CHLORIDE 5-0.45 % IV SOLN: INTRAVENOUS | Status: DC

## 2023-07-31 MED ORDER — VANCOMYCIN HCL IN DEXTROSE 1-5 GM/200ML-% IV SOLN: 1000.0000 mg | INTRAVENOUS | Status: DC

## 2023-07-31 MED ORDER — THIAMINE HCL 100 MG/ML IJ SOLN
100.0000 mg | Freq: Every day | INTRAMUSCULAR | Status: DC
  Administered 2023-08-01: 100 mg via INTRAVENOUS
  Filled 2023-07-31: qty 2

## 2023-07-31 MED ORDER — LORAZEPAM 2 MG/ML IJ SOLN: 0.0000 mg | Freq: Two times a day (BID) | INTRAMUSCULAR | Status: DC

## 2023-07-31 MED ORDER — HEPARIN (PORCINE) 25000 UT/250ML-% IV SOLN
950.0000 [IU]/h | INTRAVENOUS | Status: DC
  Administered 2023-07-31: 800 [IU]/h via INTRAVENOUS
  Filled 2023-07-31: qty 250

## 2023-07-31 MED ORDER — ACETAMINOPHEN 325 MG RE SUPP: 650.0000 mg | Freq: Four times a day (QID) | RECTAL | Status: DC | PRN

## 2023-07-31 MED ORDER — THIAMINE MONONITRATE 100 MG PO TABS: 100.0000 mg | ORAL_TABLET | Freq: Every day | ORAL | Status: DC

## 2023-07-31 MED ORDER — LORAZEPAM 2 MG/ML IJ SOLN
1.0000 mg | INTRAMUSCULAR | Status: DC | PRN
  Administered 2023-07-31: 2 mg via INTRAVENOUS
  Filled 2023-07-31: qty 1

## 2023-07-31 MED ORDER — SODIUM CHLORIDE 0.9 % IV BOLUS
500.0000 mL | Freq: Once | INTRAVENOUS | Status: AC
  Administered 2023-07-31: 500 mL via INTRAVENOUS

## 2023-07-31 MED ORDER — LACTATED RINGERS IV BOLUS
1000.0000 mL | Freq: Once | INTRAVENOUS | Status: AC
  Administered 2023-07-31: 1000 mL via INTRAVENOUS

## 2023-07-31 MED ORDER — LORAZEPAM 2 MG/ML IJ SOLN: 0.0000 mg | Freq: Four times a day (QID) | INTRAMUSCULAR | Status: DC

## 2023-07-31 MED ORDER — IBUPROFEN 400 MG PO TABS: 200.0000 mg | ORAL_TABLET | Freq: Four times a day (QID) | ORAL | Status: DC | PRN

## 2023-07-31 MED ORDER — DEXTROSE 50 % IV SOLN
50.0000 mL | INTRAVENOUS | Status: DC | PRN
  Filled 2023-07-31 (×2): qty 50

## 2023-07-31 MED ORDER — SODIUM CHLORIDE 0.9 % IV SOLN
2.0000 g | Freq: Two times a day (BID) | INTRAVENOUS | Status: DC
  Administered 2023-08-01: 2 g via INTRAVENOUS
  Filled 2023-07-31 (×2): qty 12.5

## 2023-07-31 MED ORDER — SODIUM CHLORIDE 0.9% FLUSH
10.0000 mL | Freq: Two times a day (BID) | INTRAVENOUS | Status: DC
  Administered 2023-07-31 – 2023-08-01 (×2): 10 mL via INTRAVENOUS

## 2023-07-31 MED ORDER — LACTATED RINGERS IV BOLUS (SEPSIS)
1000.0000 mL | Freq: Once | INTRAVENOUS | Status: AC
  Administered 2023-07-31: 1000 mL via INTRAVENOUS

## 2023-07-31 MED ORDER — FOLIC ACID 1 MG PO TABS: 1.0000 mg | ORAL_TABLET | Freq: Every day | ORAL | Status: DC

## 2023-07-31 MED ORDER — ONDANSETRON HCL 4 MG/2ML IJ SOLN: 4.0000 mg | Freq: Three times a day (TID) | INTRAMUSCULAR | Status: DC | PRN

## 2023-07-31 MED ORDER — IOHEXOL 300 MG/ML  SOLN
75.0000 mL | Freq: Once | INTRAMUSCULAR | Status: AC | PRN
  Administered 2023-07-31: 75 mL via INTRAVENOUS

## 2023-07-31 MED ORDER — SODIUM CHLORIDE 0.9 % IV SOLN
2.0000 g | Freq: Once | INTRAVENOUS | Status: AC
  Administered 2023-07-31: 2 g via INTRAVENOUS
  Filled 2023-07-31: qty 12.5

## 2023-07-31 NOTE — Progress Notes (Signed)
PHARMACY - ANTICOAGULATION CONSULT NOTE  Pharmacy Consult for Heparin  Indication:  portal vein thrombosis   No Known Allergies  Patient Measurements: Height: 5' (152.4 cm) Weight: 49.2 kg (108 lb 8 oz) IBW/kg (Calculated) : 45.5 Heparin Dosing Weight: 49.2 kg   Vital Signs: Temp: 97.5 F (36.4 C) (11/05 2121) Temp Source: Axillary (11/05 2121) BP: 119/58 (11/05 2130) Pulse Rate: 107 (11/05 2130)  Labs: Recent Labs    08/17/2023 1727  HGB 11.1*  HCT 34.8*  PLT 89*  APTT 42*  LABPROT 18.5*  INR 1.5*  CREATININE 1.33*  CKTOTAL 1,131*    Estimated Creatinine Clearance: 31.5 mL/min (A) (by C-G formula based on SCr of 1.33 mg/dL (H)).   Medical History: No past medical history on file.  Medications:  (Not in a hospital admission)   Assessment: Pharmacy consulted to dose heparin in this 62 year old female admitted with AMS, NPO.  Pt was on Eliquis 5 mg PO BID PTA for portal vein thrombosis, unsure of last dose. CrCl = 31.5 ml/min   Goal of Therapy:  Heparin level 0.3-0.7 units/ml aPTT 66 - 102  seconds Monitor platelets by anticoagulation protocol: Yes   Plan:  - Will order baseline HL to r/o Eliquis PTA.  Start heparin infusion at 800 units/hr - Will use aPTT to guide dosing until correlating with HL. - Will draw aPTT and HL 6 hrs after start of drip   Tashia Leiterman D 08/03/2023,11:01 PM

## 2023-07-31 NOTE — ED Triage Notes (Signed)
Pt arrives via [facility] they conducted a Wellbeing check and Pt found unresponsive in bed, recently d/c'd from hospital on 07/23/2023 Inital BP in 80s, HR: 116, no temperature, BG 75 #20 RAC 300 NS  Vitals: 81/28, RA 93%, 3L [place]  GCS: 7 per EMS

## 2023-07-31 NOTE — H&P (Signed)
History and Physical    Carrie Jones XBJ:[id number] DOB: 1960-12-26 DOA: 08/19/2023  Referring MD/NP/PA:   PCP: Patient, No Pcp Per   Patient coming from:  The patient is coming from home.     Chief Complaint: AMS  HPI: Carrie Jones is a 62 y.o. 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  Patient has altered mental status, cannot provide any medical history I have tried to call her family and friends without success.  Per nurse, her friends called earlier using number [phone] Rowan Blase Ray). I have tried to call this number, nobody picked up the phone. Therefore, most of the history is obtained by discussing the case with ED physician, per EMS report, and with the nursing staff.  Pt was recently hospitalized from 10/28 - 10/31 due to breast cancer with significant metastatic disease to bone and liver.  Dr. Orlie Dakin of oncology is consulted.  Per discharge summary, pt will need mammogram, ultrasound, and biopsy as outpatient. Oncology is to arrange. Pt also has portal vein thrombosis, and was discharged on Eliquis. Per report, wellbeing check was performed today and patient was found to be unresponsive in the bed.  Pt was not able to answer questions initially. Pt was hypotensive and tachycardic with EMS concerning for sepsis.  When I saw pt in ED, she is confused, seem to know her own name, but not orientated to place and time.  She moves all extremities.  She is very dry.  No active respiratory distress, cough, nausea, vomiting, diarrhea noted.  Does not seem to have pain anywhere.  Not sure if patient has symptoms of UTI.  Data reviewed independently and ED Course: pt was found to have WBC 15.0, thrombocytopenia with platelet 89 (platelet was 147 on 07/24/2023), positive urinalysis (cloudy appearance, negative leukocyte, many  bacteria, WBC 11-20), negative PCR for COVID, flu and RSV, CKD 1131, calcium 10.9, AKI with creatinine 1.33, BUN 66, GFR 45 (recent baseline creatinine 0.53 on 07/23/2023), abnormal liver function (ALP 172, AST 488, ALT 62, total protein 11.9), hypoglycemia with blood sugar 58, which improved to 178 after giving D D50, lactic acid 5.8 --> 7.0.  CT of head is negative for acute intracranial abnormalities.  Patient is admitted to PCU as inpatient.  Chest x-ray: 1. Emphysema, with patchy bibasilar consolidation favoring  atelectasis. 2. Stable left breast mass as seen on prior CT.   CT of abdomen/pelvis: 1. No acute findings. 2. Left breast mass with overlying skin thickening, incompletely visualized. 3. Hepatomegaly with innumerable small low-attenuation lesions throughout the liver, largest 1.8 cm in segment 7, consistent with metastatic disease. 4. Left para-aortic retroperitoneal adenopathy, stable. 5. Multiple lytic and permeative osseous lesions, consistent with metastatic disease. 6. Small volume abdominal and pelvic ascites, stable. 7. Scattered mostly dependent airspace opacities in the lung bases right greater than left. 8.  Aortic Atherosclerosis (ICD10-I70.0).      EKG: I have personally reviewed.  Sinus rhythm, QTc 445, LAD, low voltage, early R wave progression.   Review of Systems: Could not be reviewed due to altered mental status.   Allergy: No Known Allergies Could not be reviewed due to altered mental status.  No past medical history on file. Could not be reviewed due to altered mental status.  No past surgical history on file.Could not be reviewed due to altered mental status.  Social History:  reports that she has  been smoking cigarettes. She has never used smokeless tobacco. She reports current alcohol use. She reports that she does not use drugs.  Family History: No family history on file. Could not be reviewed due to altered mental status.  Prior to  Admission medications   Medication Sig Start Date End Date Taking? Authorizing Provider  apixaban (ELIQUIS) 5 MG TABS tablet Take 2 tablets (10mg ) twice daily for 7 days, then 1 tablet (5mg ) twice daily 07/25/23   Charise Killian, MD  feeding supplement (ENSURE ENLIVE / ENSURE PLUS) LIQD Take 237 mLs by mouth 2 (two) times daily between meals. 07/24/23   Darlin Priestly, MD  folic acid (FOLVITE) 1 MG tablet Take 1 tablet (1 mg total) by mouth daily. 07/25/23   Darlin Priestly, MD  Multiple Vitamin (MULTIVITAMIN WITH MINERALS) TABS tablet Take 1 tablet by mouth daily. 07/25/23   Darlin Priestly, MD  oxyCODONE (OXY IR/ROXICODONE) 5 MG immediate release tablet Take 1 tablet (5 mg total) by mouth every 6 (six) hours as needed for moderate pain (pain score 4-6) or severe pain (pain score 7-10). 07/24/23   Darlin Priestly, MD  thiamine (VITAMIN B-1) 100 MG tablet Take 1 tablet (100 mg total) by mouth daily. 07/25/23   Darlin Priestly, MD    Physical Exam: Vitals:   07/30/2023 1712 08/18/2023 1830 08/04/2023 2121 08/11/2023 2130  BP:  (!) 103/56  (!) 119/58  Pulse:  100  (!) 107  Resp:  (!) 23  (!) 24  Temp: (!) 97.2 F (36.2 C)  (!) 97.5 F (36.4 C)   TempSrc: Axillary  Axillary   SpO2:  92%  96%  Weight:      Height:       General: Not in acute distress.  Dry mucous membrane HEENT:       Eyes: PERRL, EOMI, has jaundice       ENT: No discharge from the ears and nose       Neck: No JVD, no bruit, no mass felt. Heme: No neck lymph node enlargement. Cardiac: S1/S2, RRR, No murmurs, No gallops or rubs. Respiratory: No rales, wheezing, rhonchi or rubs. GI: mildly distended, nontender, no organomegaly, BS present. GU: No hematuria Ext: No pitting leg edema bilaterally. 1+DP/PT pulse bilaterally. Musculoskeletal: No joint deformities, No joint redness or warmth, no limitation of ROM in spin. Skin: has hard lesion left breast Neuro: Confused, seem to know her own name, not oriented x 3,  cranial nerves II-XII grossly intact,  moves all extremities. Psych: Patient is not psychotic, no suicidal or hemocidal ideation.  Labs on Admission: I have personally reviewed following labs and imaging studies  CBC: Recent Labs  Lab 08/01/2023 1727  WBC 15.0*  NEUTROABS 13.0*  HGB 11.1*  HCT 34.8*  MCV 94.6  PLT 89*   Basic Metabolic Panel: Recent Labs  Lab 07/25/23 0419 08/17/2023 1727  NA 135 147*  K 3.8 3.8  CL 102 112*  CO2 21* 17*  GLUCOSE 100* 58*  BUN 33* 66*  CREATININE 1.26* 1.33*  CALCIUM 9.2 10.8*  MG 2.4 2.8*  PHOS  --  4.8*   GFR: Estimated Creatinine Clearance: 31.5 mL/min (A) (by C-G formula based on SCr of 1.33 mg/dL (H)). Liver Function Tests: Recent Labs  Lab 08/22/2023 1727  AST 488*  ALT 62*  ALKPHOS 172*  BILITOT 11.9*  PROT 6.9  ALBUMIN 2.1*   Recent Labs  Lab 07/30/2023 1727  LIPASE 16   Recent Labs  Lab 08/16/2023 2235  AMMONIA  26   Coagulation Profile: Recent Labs  Lab 08/07/2023 1727  INR 1.5*   Cardiac Enzymes: Recent Labs  Lab 08/12/2023 1727  CKTOTAL 1,131*   BNP (last 3 results) No results for input(s): "PROBNP" in the last 8760 hours. HbA1C: No results for input(s): "HGBA1C" in the last 72 hours. CBG: Recent Labs  Lab 08/17/2023 2036 08/01/2023 2100  GLUCAP 54* 178*   Lipid Profile: No results for input(s): "CHOL", "HDL", "LDLCALC", "TRIG", "CHOLHDL", "LDLDIRECT" in the last 72 hours. Thyroid Function Tests: No results for input(s): "TSH", "T4TOTAL", "FREET4", "T3FREE", "THYROIDAB" in the last 72 hours. Anemia Panel: No results for input(s): "VITAMINB12", "FOLATE", "FERRITIN", "TIBC", "IRON", "RETICCTPCT" in the last 72 hours. Urine analysis:    Component Value Date/Time   COLORURINE AMBER (A) 08/25/2023 1727   APPEARANCEUR CLOUDY (A) 08/04/2023 1727   LABSPEC 1.017 07/28/2023 1727   PHURINE 5.0 08/23/2023 1727   GLUCOSEU NEGATIVE 08/11/2023 1727   HGBUR NEGATIVE 08/06/2023 1727   BILIRUBINUR MODERATE (A) 08/04/2023 1727   KETONESUR 5 (A)  08/15/2023 1727   PROTEINUR NEGATIVE 08/04/2023 1727   NITRITE NEGATIVE 08/06/2023 1727   LEUKOCYTESUR NEGATIVE 08/21/2023 1727   Sepsis Labs: @LABRCNTIP (procalcitonin:4,lacticidven:4) ) Recent Results (from the past 240 hour(s))  Resp panel by RT-PCR (RSV, Flu A&B, Covid) Anterior Nasal Swab     Status: None   Collection Time: 08/19/2023  5:27 PM   Specimen: Anterior Nasal Swab  Result Value Ref Range Status   SARS Coronavirus 2 by RT PCR NEGATIVE NEGATIVE Final    Comment: (NOTE) SARS-CoV-2 target nucleic acids are NOT DETECTED.  The SARS-CoV-2 RNA is generally detectable in upper respiratory specimens during the acute phase of infection. The lowest concentration of SARS-CoV-2 viral copies this assay can detect is 138 copies/mL. A negative result does not preclude SARS-Cov-2 infection and should not be used as the sole basis for treatment or other patient management decisions. A negative result may occur with  improper specimen collection/handling, submission of specimen other than nasopharyngeal swab, presence of viral mutation(s) within the areas targeted by this assay, and inadequate number of viral copies(<138 copies/mL). A negative result must be combined with clinical observations, patient history, and epidemiological information. The expected result is Negative.  Fact Sheet for Patients:  BloggerCourse.com  Fact Sheet for Healthcare Providers:  SeriousBroker.it  This test is no t yet approved or cleared by the [place] FDA and  has been authorized for detection and/or diagnosis of SARS-CoV-2 by FDA under an Emergency Use Authorization (EUA). This EUA will remain  in effect (meaning this test can be used) for the duration of the COVID-19 declaration under Section 564(b)(1) of the Act, 21 U.S.C.section 360bbb-3(b)(1), unless the authorization is terminated  or revoked sooner.       Influenza A by PCR NEGATIVE  NEGATIVE Final   Influenza B by PCR NEGATIVE NEGATIVE Final    Comment: (NOTE) The Xpert Xpress SARS-CoV-2/FLU/RSV plus assay is intended as an aid in the diagnosis of influenza from Nasopharyngeal swab specimens and should not be used as a sole basis for treatment. Nasal washings and aspirates are unacceptable for Xpert Xpress SARS-CoV-2/FLU/RSV testing.  Fact Sheet for Patients: BloggerCourse.com  Fact Sheet for Healthcare Providers: SeriousBroker.it  This test is not yet approved or cleared by the [place] FDA and has been authorized for detection and/or diagnosis of SARS-CoV-2 by FDA under an Emergency Use Authorization (EUA). This EUA will remain in effect (meaning this test can be used) for the duration of  the COVID-19 declaration under Section 564(b)(1) of the Act, 21 U.S.C. section 360bbb-3(b)(1), unless the authorization is terminated or revoked.     Resp Syncytial Virus by PCR NEGATIVE NEGATIVE Final    Comment: (NOTE) Fact Sheet for Patients: BloggerCourse.com  Fact Sheet for Healthcare Providers: SeriousBroker.it  This test is not yet approved or cleared by the [place] FDA and has been authorized for detection and/or diagnosis of SARS-CoV-2 by FDA under an Emergency Use Authorization (EUA). This EUA will remain in effect (meaning this test can be used) for the duration of the COVID-19 declaration under Section 564(b)(1) of the Act, 21 U.S.C. section 360bbb-3(b)(1), unless the authorization is terminated or revoked.  Performed at [facility], [address] [place], [place] [address]   Urine Culture     Status: None (Preliminary result)   Collection Time: 08/21/2023  5:27 PM   Specimen: Urine, Catheterized  Result Value Ref Range Status   Specimen Description   Final    URINE, CATHETERIZED Performed at [facility], [address], [place], [place] [address]    Special Requests   Final    NONE Reflexed from [id number] Performed at [facility], [address] [place] [place], [place] [address]    Culture PENDING  Incomplete   Report Status PENDING  Incomplete     Radiological Exams on Admission: CT ABDOMEN PELVIS W CONTRAST  Result Date: 08/14/2023 CLINICAL DATA:  Altered mental status, distended abdomen EXAM: CT ABDOMEN AND PELVIS WITH CONTRAST TECHNIQUE: Multidetector CT imaging of the abdomen and pelvis was performed using the standard protocol following bolus administration of intravenous contrast. RADIATION DOSE REDUCTION: This exam was performed according to the departmental dose-optimization program which includes automated exposure control, adjustment of the mA and/or kV according to patient size and/or use of iterative reconstruction technique. CONTRAST:  75mL OMNIPAQUE IOHEXOL 300 MG/ML  SOLN COMPARISON:  07/23/2023 FINDINGS: Lower chest: No pleural or pericardial effusion. Scattered mostly dependent airspace opacities in the lung bases right greater than left. Left breast mass with overlying skin thickening, incompletely visualized. Hepatobiliary: Hepatomegaly with innumerable small low-attenuation lesions throughout the liver, largest 1.8 cm in segment 7. Geographic region of low attenuation in the right and medial left lobes suggesting geographic fatty infiltration, progressive on the right since previous exam. Vicarious excretion of contrast into the lumen of the nondilated gallbladder. No definite biliary ductal dilatation. Pancreas: Unremarkable. No pancreatic ductal dilatation or surrounding inflammatory changes. Spleen: Normal in size without focal abnormality. Adrenals/Urinary Tract: No adrenal mass. Symmetric renal parenchymal enhancement without focal lesion or hydronephrosis. Urinary bladder partially distended. Stomach/Bowel: Stomach is decompressed, without acute finding. Small bowel nondistended. Appendix  not localized. Colon incompletely distended with scattered diverticula. Vascular/Lymphatic: Mild scattered aortoiliac calcified atheromatous plaque without AAA. Left para-aortic retroperitoneal adenopathy up to 1.2 cm short axis diameter as before. Reproductive: Calcified uterine fibroids. Prominent fluid attenuation within the endometrium. No adnexal mass. Other: Small volume abdominal and pelvic ascites stable. No free air. Musculoskeletal: Facet DJD L5-S1 allowing grade 1 anterolisthesis. Lucent lesion in the L1 vertebral body involving the left pedicle. Lucent lesion involving the right superior articular process of L2. Lytic lesion involving the first sacral segment. Lytic lesions involving spinous process of T10. Permeative lesions involving right ilium, and left iliac wing. No definite pathologic fracture. IMPRESSION: 1. No acute findings. 2. Left breast mass with overlying skin thickening, incompletely visualized. 3. Hepatomegaly with innumerable small low-attenuation lesions throughout the liver, largest 1.8 cm in segment 7,  consistent with metastatic disease. 4. Left para-aortic retroperitoneal adenopathy, stable. 5. Multiple lytic and permeative osseous lesions, consistent with metastatic disease. 6. Small volume abdominal and pelvic ascites, stable. 7. Scattered mostly dependent airspace opacities in the lung bases right greater than left. 8.  Aortic Atherosclerosis (ICD10-I70.0). Electronically Signed   By: Corlis Leak M.D.   On: 08/17/2023 21:07   DG Chest Port 1 View  Result Date: 08/05/2023 CLINICAL DATA:  Sepsis, found unresponsive EXAM: PORTABLE CHEST 1 VIEW COMPARISON:  None Available. FINDINGS: Single frontal view of the chest demonstrates an unremarkable cardiac silhouette. There is diffuse interstitial prominence throughout the lungs, with patchy bibasilar consolidation favoring atelectasis. No effusion or pneumothorax. Increased soft tissue density overlying the left chest consistent with  left breast mass identified on prior CT. No acute fractures. The suspected bony metastasis within the spine and thoracic cage seen on prior CT are less apparent by x-ray. IMPRESSION: 1. Emphysema, with patchy bibasilar consolidation favoring atelectasis. 2. Stable left breast mass as seen on prior CT. Electronically Signed   By: Sharlet Salina M.D.   On: 08/24/2023 20:17   CT Head Wo Contrast  Result Date: 08/13/2023 CLINICAL DATA:  Altered mental status, GCS 10, evaluating intracranial pathology EXAM: CT HEAD WITHOUT CONTRAST TECHNIQUE: Contiguous axial images were obtained from the base of the skull through the vertex without intravenous contrast. RADIATION DOSE REDUCTION: This exam was performed according to the departmental dose-optimization program which includes automated exposure control, adjustment of the mA and/or kV according to patient size and/or use of iterative reconstruction technique. COMPARISON:  None Available. FINDINGS: Brain: No hemorrhage. No hydrocephalus. No extra-axial fluid collection. No CT evidence of an acute cortical infarct. No mass effect. No mass lesion. With a background of mild chronic microvascular ischemic change. Vascular: No hyperdense vessel or unexpected calcification. Skull: Normal. Negative for fracture or focal lesion. Sinuses/Orbits: No middle ear or mastoid effusion. Paranasal sinuses are clear. Orbits are unremarkable. Other: None. IMPRESSION: No acute intracranial abnormality. Electronically Signed   By: Lorenza Cambridge M.D.   On: 08/19/2023 19:10      Assessment/Plan Principal Problem:   Severe sepsis with septic shock (HCC) Active Problems:   UTI (urinary tract infection)   Acute metabolic encephalopathy   Rhabdomyolysis   Portal vein thrombosis   Abnormal LFTs   Hypercalcemia   Hypoglycemia   Thrombocytopenia (HCC)   AKI (acute kidney injury) (HCC)   Alcohol abuse   Tobacco abuse   Breast cancer metastasized to bone (HCC)   Vulvar  mass   Assessment and Plan:  Severe sepsis with septic shock (CODE): Patient meets criteria for severe sepsis versus septic shock, with WBC 15.0, heart rate 107, RR 35, lactic acid 5.8 --> 7.0.  Blood pressure 119/58.  Possibly due to UTI, but not sure if UTI is the only source of infection.  Will continue broad antibiotics.  -Admitted to SDU as inpatient -Antibiotics: Vancomycin, cefepime and Flagyl -Follow up of blood culture and urine culture -IV fluid: Total of 2.5 L IV fluid bolus, but 75 cc/h -Trend lactic acid level -Check procalcitonin level  Addendum: lactic acid is trending up to 7.7 -will give another 500 cc of NS bolus, and then 75 cc/h of NS   UTI (urinary tract infection) -Follow-up urine culture -See above  Acute metabolic encephalopathy: Likely multifactorial etiology.  CT head negative. -Frequent neurocheck -Fall precaution -Follow-up ammonia level  Rhabdomyolysis: [id number] -IV fluid as above -Repeat CK in morning  Portal vein thrombosis: Patient  is on Eliquis -Switch to IV heparin since patient cannot take Eliquis safely  Abnormal LFTs: Likely multifactorial etiology, including liver metastasized disease, portal vein thrombosis, rhabdomyolysis -Avoid using Tylenol -Check ammonia level  Hypercalcemia: Calcium 10.8, likely due to dehydration.  Malignancy may have contributed partially -On IV fluid as above  Hypoglycemia -As needed D50 -Check blood sugar level every 2 hours  Thrombocytopenia (HCC): Platelet 89, may be due to ongoing infection -Check LDH and peripheral smear  AKI (acute kidney injury) (HCC): -On IV fluid as above -Careful use ibuprofen as needed (can only use Tylenol due to abnormal liver function)  Alcohol abuse and Tobacco abuse -Need counseling about importance of quitting alcohol and tobacco use when patient mental status improves -Nicotine patch -CIWA protocol  Breast cancer metastasized to bone and liver -Please contact  oncology in the morning -Palliative consult        CRITICAL CARE Performed by: Lorretta Harp   Total critical care time:  70 minutes  Critical care time was exclusive of separately billable procedures and treating other patients.  Critical care was necessary to treat or prevent imminent or life-threatening deterioration.  Critical care was time spent personally by me on the following activities: development of treatment plan with patient and/or surrogate as well as nursing, discussions with consultants, evaluation of patient's response to treatment, examination of patient, obtaining history from patient or surrogate, ordering and performing treatments and interventions, ordering and review of laboratory studies, ordering and review of radiographic studies, pulse oximetry and re-evaluation of patient's condition.            DVT ppx: on IV Heparin     Code Status: DNR (pt has DNR form with her)  Family Communication: I have tried to call Georgette Shell [phone], who did not pick up the phone. I could not leave a message since mailbox is full.  Disposition Plan:  Anticipate discharge back to previous environment  Consults called:  none  Admission status and Level of care: Progressive:  as inpt       Dispo: The patient is from: Home              Anticipated d/c is to:  To be determined              Anticipated d/c date is: 2 days              Patient currently is not medically stable to d/c.    Severity of Illness:  The appropriate patient status for this patient is INPATIENT. Inpatient status is judged to be reasonable and necessary in order to provide the required intensity of service to ensure the patient's safety. The patient's presenting symptoms, physical exam findings, and initial radiographic and laboratory data in the context of their chronic comorbidities is felt to place them at high risk for further clinical deterioration. Furthermore, it is not  anticipated that the patient will be medically stable for discharge from the hospital within 2 midnights of admission.   * I certify that at the point of admission it is my clinical judgment that the patient will require inpatient hospital care spanning beyond 2 midnights from the point of admission due to high intensity of service, high risk for further deterioration and high frequency of surveillance required.*       Date of Service 08/16/2023    Lorretta Harp [facility]   If 7PM-7AM, please contact night-coverage [email or web address] 08/19/2023, 11:49 PM

## 2023-07-31 NOTE — Progress Notes (Signed)
CODE SEPSIS - PHARMACY COMMUNICATION  **Broad Spectrum Antibiotics should be administered within 1 hour of Sepsis diagnosis**  Time Code Sepsis Called/Page Received: 1842  Antibiotics Ordered: Cefepime, vancomycin, metronidazole  Time of 1st antibiotic administration: 1908  Additional action taken by pharmacy: N/A  Tressie Ellis 07/29/2023  8:36 PM

## 2023-07-31 NOTE — Sepsis Progress Note (Signed)
Elink monitoring for the code sepsis protocol.  

## 2023-07-31 NOTE — Progress Notes (Signed)
Pharmacy Antibiotic Note  Carrie Jones is a 62 y.o. female admitted on 07/28/2023 with sepsis and UTI.  Pharmacy has been consulted for Vanc and cefepime dosing.  Plan: Cefepime 2 gm IV X 1 given in ED on 11/5 @ 1908. Cefepime 2 gm IV Q12H ordered to start on 11/6 @ 0700.  Vancomycin 1 gm IV X 1 given in ED on 11/5 @ 1924. Vancomycin 1 gm IV Q48H ordered to start on 11/7 @ 1900.  AUC = 462.1 Vanc trough = 8.7   Height: 5' (152.4 cm) Weight: 49.2 kg (108 lb 8 oz) IBW/kg (Calculated) : 45.5  Temp (24hrs), Avg:97.4 F (36.3 C), Min:97.2 F (36.2 C), Max:97.5 F (36.4 C)  Recent Labs  Lab 07/25/23 0419 08/07/2023 1727 08/24/2023 1918  WBC  --  15.0*  --   CREATININE 1.26* 1.33*  --   LATICACIDVEN  --  5.8* 7.0*    Estimated Creatinine Clearance: 31.5 mL/min (A) (by C-G formula based on SCr of 1.33 mg/dL (H)).    No Known Allergies  Antimicrobials this admission:   >>    >>   Dose adjustments this admission:   Microbiology results:  BCx:   UCx:    Sputum:    MRSA PCR:   Thank you for allowing pharmacy to be a part of this patient's care.  Jaslen Adcox D 08/05/2023 10:13 PM

## 2023-07-31 NOTE — Consult Note (Signed)
PHARMACY -  BRIEF ANTIBIOTIC NOTE   Pharmacy has received consult(s) for cefepime and vancomycin from an ED provider. Patient is also ordered metronidazole.  The patient's profile has been reviewed for ht/wt/allergies/indication/available labs.    One time order(s) placed for  --Cefepime 2 g IV --Vancomycin 1 g IV  Further antibiotics/pharmacy consults should be ordered by admitting physician if indicated.                       Thank you, Tressie Ellis 07/28/2023  8:35 PM

## 2023-07-31 NOTE — ED Provider Notes (Signed)
[facility] Provider Note    Event Date/Time   First MD Initiated Contact with Patient 08/03/2023 1705     (approximate)   History   Altered Mental Status (Pt believed to have infection )   HPI Carrie Jones is a 62 y.o. female with history of alcohol and tobacco abuse, recent diagnosis of metastatic breast cancer presenting today for altered mental status.  Wellbeing check was performed and patient was found to be unresponsive in the bed.  Not able to answer questions initially.  Was hypotensive and tachycardic with EMS concerning for sepsis.  On arrival here, patient able to state her name and age but not able to answer other questions.  Moaning and appears to be in pain.  Chart review: Patient recently admitted to the hospital for new diagnosis of metastatic breast cancer.  Seen by oncology with plan follow-up.  Had not seen oncology yet outpatient.     Physical Exam   Triage Vital Signs: ED Triage Vitals  Encounter Vitals Group     BP 08/10/2023 1710 116/65     Systolic BP Percentile --      Diastolic BP Percentile --      Pulse --      Resp 08/07/2023 1710 (!) 35     Temp 08/07/2023 1712 (!) 97.2 F (36.2 C)     Temp Source 08/08/2023 1712 Axillary     SpO2 08/12/2023 1710 100 %     Weight 08/25/2023 1711 108 lb 8 oz (49.2 kg)     Height 08/16/2023 1711 5' (1.524 m)     Head Circumference --      Peak Flow --      Pain Score --      Pain Loc --      Pain Education --      Exclude from Growth Chart --     Most recent vital signs: Vitals:   08/19/2023 2121 08/08/2023 2130  BP:  (!) 119/58  Pulse:  (!) 107  Resp:  (!) 24  Temp: (!) 97.5 F (36.4 C)   SpO2:  96%   I have reviewed the vital signs. General:  Awake, eyes open and moaning.  Able to tell me her name but unable to answer other questions. Head:  Normocephalic, Atraumatic. EENT:  PERRL, EOMI, Oral mucosa pink and moist, Neck is supple. Cardiovascular: Regular rate, 2+ distal  pulses. Respiratory:  Normal respiratory effort, symmetrical expansion, no distress.   Extremities:  Moving all four extremities through full ROM without pain.   Neuro:  Alert.  Only oriented to name.  GCS 11 (E4, V2, M5) Skin: Large mass to her left breast likely from her known breast cancer. Psych: Appropriate affect.    ED Results / Procedures / Treatments   Labs (all labs ordered are listed, but only abnormal results are displayed) Labs Reviewed  LACTIC ACID, PLASMA - Abnormal; Notable for the following components:      Result Value   Lactic Acid, Venous 5.8 (*)    All other components within normal limits  LACTIC ACID, PLASMA - Abnormal; Notable for the following components:   Lactic Acid, Venous 7.0 (*)    All other components within normal limits  COMPREHENSIVE METABOLIC PANEL - Abnormal; Notable for the following components:   Sodium 147 (*)    Chloride 112 (*)    CO2 17 (*)    Glucose, Bld 58 (*)    BUN 66 (*)    Creatinine, Ser  1.33 (*)    Calcium 10.8 (*)    Albumin 2.1 (*)    AST 488 (*)    ALT 62 (*)    Alkaline Phosphatase 172 (*)    Total Bilirubin 11.9 (*)    GFR, Estimated 45 (*)    Anion gap 18 (*)    All other components within normal limits  CBC WITH DIFFERENTIAL/PLATELET - Abnormal; Notable for the following components:   WBC 15.0 (*)    RBC 3.68 (*)    Hemoglobin 11.1 (*)    HCT 34.8 (*)    RDW 22.8 (*)    Platelets 89 (*)    nRBC 37.8 (*)    Neutro Abs 13.0 (*)    Abs Immature Granulocytes 0.13 (*)    All other components within normal limits  PROTIME-INR - Abnormal; Notable for the following components:   Prothrombin Time 18.5 (*)    INR 1.5 (*)    All other components within normal limits  APTT - Abnormal; Notable for the following components:   aPTT 42 (*)    All other components within normal limits  URINALYSIS, W/ REFLEX TO CULTURE (INFECTION SUSPECTED) - Abnormal; Notable for the following components:   Color, Urine AMBER (*)     APPearance CLOUDY (*)    Bilirubin Urine MODERATE (*)    Ketones, ur 5 (*)    Bacteria, UA MANY (*)    All other components within normal limits  CK - Abnormal; Notable for the following components:   Total CK 1,131 (*)    All other components within normal limits  MAGNESIUM - Abnormal; Notable for the following components:   Magnesium 2.8 (*)    All other components within normal limits  PHOSPHORUS - Abnormal; Notable for the following components:   Phosphorus 4.8 (*)    All other components within normal limits  CBG MONITORING, ED - Abnormal; Notable for the following components:   Glucose-Capillary 54 (*)    All other components within normal limits  CBG MONITORING, ED - Abnormal; Notable for the following components:   Glucose-Capillary 178 (*)    All other components within normal limits  RESP PANEL BY RT-PCR (RSV, FLU A&B, COVID)  [id number]  URINE CULTURE  CULTURE, BLOOD (ROUTINE X 2)  CULTURE, BLOOD (ROUTINE X 2)  LIPASE, BLOOD  PROCALCITONIN  AMMONIA  LACTATE DEHYDROGENASE  LACTIC ACID, PLASMA  LACTIC ACID, PLASMA  LACTIC ACID, PLASMA  LACTIC ACID, PLASMA  COMPREHENSIVE METABOLIC PANEL  CBC  CK  TECHNOLOGIST SMEAR REVIEW  HEPARIN LEVEL (UNFRACTIONATED)     EKG My EKG interpretation: Rate of 109, sinus tachycardia, normal axis, normal intervals.  No acute ST elevations or depressions   RADIOLOGY Independently interpreted CT imaging and agree with radiology read   PROCEDURES:  Critical Care performed: Yes, see critical care procedure note(s)  .Critical Care  Performed by: Janith Lima, MD Authorized by: Janith Lima, MD   Critical care provider statement:    Critical care time (minutes):  30   Critical care was necessary to treat or prevent imminent or life-threatening deterioration of the following conditions:  Sepsis   Critical care was time spent personally by me on the following activities:  Development of treatment plan with patient or surrogate,  discussions with consultants, evaluation of patient's response to treatment, examination of patient, ordering and review of laboratory studies, ordering and review of radiographic studies, ordering and performing treatments and interventions, pulse oximetry, re-evaluation of patient's condition and review  of old charts   Care discussed with: admitting provider      MEDICATIONS ORDERED IN ED: Medications  sodium chloride flush (NS) 0.9 % injection 10 mL (10 mLs Intravenous Given 08/07/2023 2121)  ondansetron (ZOFRAN) injection 4 mg (has no administration in time range)  acetaminophen (TYLENOL) suppository 650 mg (has no administration in time range)  nicotine (NICODERM CQ - dosed in mg/24 hours) patch 21 mg (21 mg Transdermal Patch Applied 07/30/2023 2310)  LORazepam (ATIVAN) tablet 1-4 mg ( Oral See Alternative 08/18/2023 2303)    Or  LORazepam (ATIVAN) injection 1-4 mg (2 mg Intravenous Given 08/14/2023 2303)  thiamine (VITAMIN B1) tablet 100 mg (100 mg Oral Not Given 08/07/2023 2246)    Or  thiamine (VITAMIN B1) injection 100 mg ( Intravenous See Alternative 08/17/2023 2246)  folic acid (FOLVITE) tablet 1 mg (0 mg Oral Hold 08/16/2023 2246)  multivitamin with minerals tablet 1 tablet (1 tablet Oral Not Given 08/14/2023 2247)  LORazepam (ATIVAN) injection 0-4 mg (has no administration in time range)    Followed by  LORazepam (ATIVAN) injection 0-4 mg (has no administration in time range)  dextrose 50 % solution 50 mL (has no administration in time range)  vancomycin (VANCOCIN) IVPB 1000 mg/200 mL premix (has no administration in time range)  ceFEPIme (MAXIPIME) 2 g in sodium chloride 0.9 % 100 mL IVPB (has no administration in time range)  metroNIDAZOLE (FLAGYL) IVPB 500 mg (has no administration in time range)  albuterol (PROVENTIL) (2.5 MG/3ML) 0.083% nebulizer solution 2.5 mg (has no administration in time range)  heparin ADULT infusion 100 units/mL (25000 units/220mL) (has no administration in time range)   oxyCODONE (Oxy IR/ROXICODONE) immediate release tablet 5 mg (has no administration in time range)  lactated ringers bolus 1,000 mL (0 mLs Intravenous Stopped 08/23/2023 2028)  iohexol (OMNIPAQUE) 300 MG/ML solution 75 mL (75 mLs Intravenous Contrast Given 08/11/2023 1840)  lactated ringers bolus 1,000 mL (0 mLs Intravenous Stopped 08/09/2023 2008)  ceFEPIme (MAXIPIME) 2 g in sodium chloride 0.9 % 100 mL IVPB (0 g Intravenous Stopped 08/06/2023 1938)  metroNIDAZOLE (FLAGYL) IVPB 500 mg (0 mg Intravenous Stopped 08/14/2023 2013)  vancomycin (VANCOCIN) IVPB 1000 mg/200 mL premix (0 mg Intravenous Stopped 08/19/2023 2024)  dextrose 50 % solution 50 mL (50 mLs Intravenous Given 08/25/2023 2043)  lactated ringers bolus 1,000 mL (0 mLs Intravenous Stopped 08/19/2023 2309)  sodium chloride 0.9 % bolus 500 mL (500 mLs Intravenous New Bag/Given 07/27/2023 2319)     IMPRESSION / MDM / ASSESSMENT AND PLAN / ED COURSE  I reviewed the triage vital signs and the nursing notes.                              Differential diagnosis includes, but is not limited to, septic shock, ICH, intra-abdominal infection, UTI, pneumonia  Patient's presentation is most consistent with acute presentation with potential threat to life or bodily function.  Patient is a 62 year old female with recent diagnosis of metastatic cancer presenting today for altered mental status.  GCS 11 on arrival.  Vital signs notable for hypothermia and tachycardia but no overt hypotension.  Patient meeting sepsis criteria.  Lactic acid of 5.8.  Given broad-spectrum antibiotics as well as 2 L of fluid.  UA indicative of UTI is most likely source with WBC elevated at 15K.  Lactic acid on reassessment elevated to 7 patient was given additional liter of fluids.  Elevated CK as well.  No  other acute pathology intra-abdominal he.  Patient admitted to hospitalist for ongoing sepsis treatment.  The patient is on the cardiac monitor to evaluate for evidence of arrhythmia and/or  significant heart rate changes. Clinical Course as of 08/15/2023 2320  Tue Jul 31, 2023  1816 Urinalysis, w/ Reflex to Culture (Infection Suspected) -Urine, Catheterized(!) Many bacteria along with WBCs.  Will empirically treat for sepsis at this time [DW]  1841 Lactic Acid, Venous(!!): 5.8 [DW]  1841 WBC(!): 15.0 [DW]  2052 Glucose-Capillary(!): 54 Given amp of D50 [DW]  2122 Lactic Acid, Venous(!!): 7.0 Wrosening lactic. Will give additional fluids at this time [DW]    Clinical Course User Index [DW] Janith Lima, MD     FINAL CLINICAL IMPRESSION(S) / ED DIAGNOSES   Final diagnoses:  Sepsis, due to unspecified organism, unspecified whether acute organ dysfunction present [facility])  Acute cystitis with hematuria  Malignant neoplasm metastatic to breast [facility])     Rx / DC Orders   ED Discharge Orders     None        Note:  This document was prepared using Dragon voice recognition software and may include unintentional dictation errors.   Janith Lima, MD 08/07/2023 2322

## 2023-08-01 ENCOUNTER — Other Ambulatory Visit: Payer: Self-pay

## 2023-08-01 DIAGNOSIS — Z515 Encounter for palliative care: Secondary | ICD-10-CM

## 2023-08-01 DIAGNOSIS — A419 Sepsis, unspecified organism: Secondary | ICD-10-CM

## 2023-08-01 DIAGNOSIS — R6521 Severe sepsis with septic shock: Secondary | ICD-10-CM | POA: Diagnosis not present

## 2023-08-01 LAB — BLOOD GAS, ARTERIAL
Acid-base deficit: 10.5 mmol/L — ABNORMAL HIGH (ref 0.0–2.0)
Bicarbonate: 13.7 mmol/L — ABNORMAL LOW (ref 20.0–28.0)
FIO2: 0.4 %
O2 Saturation: 92.3 %
Patient temperature: 37
pCO2 arterial: 26 mm[Hg] — ABNORMAL LOW (ref 32–48)
pH, Arterial: 7.33 — ABNORMAL LOW (ref 7.35–7.45)
pO2, Arterial: 62 mm[Hg] — ABNORMAL LOW (ref 83–108)

## 2023-08-01 LAB — COMPREHENSIVE METABOLIC PANEL
ALT: 66 U/L — ABNORMAL HIGH (ref 0–44)
AST: 537 U/L — ABNORMAL HIGH (ref 15–41)
Albumin: 1.9 g/dL — ABNORMAL LOW (ref 3.5–5.0)
Alkaline Phosphatase: 144 U/L — ABNORMAL HIGH (ref 38–126)
Anion gap: 18 — ABNORMAL HIGH (ref 5–15)
BUN: 67 mg/dL — ABNORMAL HIGH (ref 8–23)
CO2: 15 mmol/L — ABNORMAL LOW (ref 22–32)
Calcium: 10.1 mg/dL (ref 8.9–10.3)
Chloride: 111 mmol/L (ref 98–111)
Creatinine, Ser: 1.54 mg/dL — ABNORMAL HIGH (ref 0.44–1.00)
GFR, Estimated: 38 mL/min — ABNORMAL LOW (ref >60)
Glucose, Bld: 75 mg/dL (ref 70–99)
Potassium: 4.1 mmol/L (ref 3.5–5.1)
Sodium: 144 mmol/L (ref 135–145)
Total Bilirubin: 11.1 mg/dL — ABNORMAL HIGH (ref <1.2)
Total Protein: 6.3 g/dL — ABNORMAL LOW (ref 6.5–8.1)

## 2023-08-01 LAB — GLUCOSE, CAPILLARY
Glucose-Capillary: 79 mg/dL (ref 70–99)
Glucose-Capillary: 90 mg/dL (ref 70–99)
Glucose-Capillary: 51 mg/dL — ABNORMAL LOW (ref 70–99)
Glucose-Capillary: 60 mg/dL — ABNORMAL LOW (ref 70–99)
Glucose-Capillary: 102 mg/dL — ABNORMAL HIGH (ref 70–99)
Glucose-Capillary: 64 mg/dL — ABNORMAL LOW (ref 70–99)
Glucose-Capillary: 120 mg/dL — ABNORMAL HIGH (ref 70–99)
Glucose-Capillary: 111 mg/dL — ABNORMAL HIGH (ref 70–99)
Glucose-Capillary: 110 mg/dL — ABNORMAL HIGH (ref 70–99)
Glucose-Capillary: 78 mg/dL (ref 70–99)
Glucose-Capillary: 72 mg/dL (ref 70–99)

## 2023-08-01 LAB — CBC
HCT: 30.4 % — ABNORMAL LOW (ref 36.0–46.0)
Hemoglobin: 9.8 g/dL — ABNORMAL LOW (ref 12.0–15.0)
MCH: 29.7 pg (ref 26.0–34.0)
MCHC: 32.2 g/dL (ref 30.0–36.0)
MCV: 92.1 fL (ref 80.0–100.0)
Platelets: 89 10*3/uL — ABNORMAL LOW (ref 150–400)
RBC: 3.3 MIL/uL — ABNORMAL LOW (ref 3.87–5.11)
RDW: 23.6 % — ABNORMAL HIGH (ref 11.5–15.5)
WBC: 13.5 10*3/uL — ABNORMAL HIGH (ref 4.0–10.5)
nRBC: 66.5 % — ABNORMAL HIGH (ref 0.0–0.2)

## 2023-08-01 LAB — CK: Total CK: 1134 U/L — ABNORMAL HIGH (ref 38–234)

## 2023-08-01 LAB — CBG MONITORING, ED: Glucose-Capillary: 96 mg/dL (ref 70–99)

## 2023-08-01 LAB — HEPARIN LEVEL (UNFRACTIONATED)
Heparin Unfractionated: <0.1 [IU]/mL — ABNORMAL LOW (ref 0.30–0.70)
Heparin Unfractionated: 0.12 [IU]/mL — ABNORMAL LOW (ref 0.30–0.70)
Heparin Unfractionated: 0.33 [IU]/mL (ref 0.30–0.70)
Heparin Unfractionated: 0.33 [IU]/mL (ref 0.30–0.70)

## 2023-08-01 LAB — MRSA NEXT GEN BY PCR, NASAL: MRSA by PCR Next Gen: NOT DETECTED

## 2023-08-01 LAB — LACTIC ACID, PLASMA
Lactic Acid, Venous: 7.7 mmol/L (ref 0.5–1.9)
Lactic Acid, Venous: 7.3 mmol/L (ref 0.5–1.9)
Lactic Acid, Venous: 7.8 mmol/L (ref 0.5–1.9)
Lactic Acid, Venous: 8.7 mmol/L (ref 0.5–1.9)

## 2023-08-01 MED ORDER — SODIUM CHLORIDE 0.9 % IV SOLN: INTRAVENOUS | Status: DC

## 2023-08-01 MED ORDER — DEXTROSE 50 % IV SOLN
12.5000 g | INTRAVENOUS | Status: AC
  Administered 2023-08-01: 12.5 g via INTRAVENOUS

## 2023-08-01 MED ORDER — ACETAMINOPHEN 650 MG RE SUPP: 650.0000 mg | Freq: Four times a day (QID) | RECTAL | Status: DC | PRN

## 2023-08-01 MED ORDER — GLYCOPYRROLATE 0.2 MG/ML IJ SOLN
0.2000 mg | INTRAMUSCULAR | Status: DC | PRN
  Administered 2023-08-02: 0.2 mg via INTRAVENOUS
  Filled 2023-08-01: qty 1

## 2023-08-01 MED ORDER — ORAL CARE MOUTH RINSE: 15.0000 mL | OROMUCOSAL | Status: DC

## 2023-08-01 MED ORDER — MORPHINE SULFATE (PF) 2 MG/ML IV SOLN
1.0000 mg | INTRAVENOUS | Status: DC | PRN
  Administered 2023-08-01 – 2023-08-02 (×6): 1 mg via INTRAVENOUS
  Filled 2023-08-01 (×6): qty 1

## 2023-08-01 MED ORDER — MORPHINE SULFATE (PF) 2 MG/ML IV SOLN
1.0000 mg | INTRAVENOUS | Status: DC | PRN
  Administered 2023-08-01: 1 mg via INTRAVENOUS
  Filled 2023-08-01: qty 1

## 2023-08-01 MED ORDER — POLYVINYL ALCOHOL 1.4 % OP SOLN: 1.0000 [drp] | Freq: Four times a day (QID) | OPHTHALMIC | Status: DC | PRN

## 2023-08-01 MED ORDER — GLYCOPYRROLATE 0.2 MG/ML IJ SOLN: 0.2000 mg | INTRAMUSCULAR | Status: DC | PRN

## 2023-08-01 MED ORDER — SODIUM CHLORIDE 0.9 % IV BOLUS
500.0000 mL | Freq: Once | INTRAVENOUS | Status: AC
  Administered 2023-08-01: 500 mL via INTRAVENOUS

## 2023-08-01 MED ORDER — ACETAMINOPHEN 325 MG PO TABS: 650.0000 mg | ORAL_TABLET | Freq: Four times a day (QID) | ORAL | Status: DC | PRN

## 2023-08-01 MED ORDER — DEXTROSE-SODIUM CHLORIDE 5-0.9 % IV SOLN: INTRAVENOUS | Status: DC

## 2023-08-01 MED ORDER — NOREPINEPHRINE 4 MG/250ML-% IV SOLN: 0.0000 ug/min | INTRAVENOUS | Status: DC

## 2023-08-01 MED ORDER — NOREPINEPHRINE 4 MG/250ML-% IV SOLN: 2.0000 ug/min | INTRAVENOUS | Status: DC

## 2023-08-01 MED ORDER — GLYCOPYRROLATE 1 MG PO TABS: 1.0000 mg | ORAL_TABLET | ORAL | Status: DC | PRN

## 2023-08-01 MED ORDER — SODIUM CHLORIDE 0.9 % IV SOLN: 250.0000 mL | INTRAVENOUS | Status: DC

## 2023-08-01 MED ORDER — CHLORHEXIDINE GLUCONATE CLOTH 2 % EX PADS
6.0000 | MEDICATED_PAD | Freq: Every day | CUTANEOUS | Status: DC
  Administered 2023-08-01: 6 via TOPICAL

## 2023-08-01 MED ORDER — LACTATED RINGERS IV BOLUS
1000.0000 mL | Freq: Once | INTRAVENOUS | Status: AC
  Administered 2023-08-01: 1000 mL via INTRAVENOUS

## 2023-08-01 MED ORDER — HYDROCORTISONE SOD SUC (PF) 100 MG IJ SOLR
100.0000 mg | Freq: Three times a day (TID) | INTRAMUSCULAR | Status: DC
  Administered 2023-08-01: 100 mg via INTRAVENOUS
  Filled 2023-08-01: qty 2

## 2023-08-01 MED ORDER — DEXTROSE 50 % IV SOLN: 100.0000 mL | INTRAVENOUS | Status: DC | PRN

## 2023-08-01 MED ORDER — HEPARIN BOLUS VIA INFUSION
1500.0000 [IU] | Freq: Once | INTRAVENOUS | Status: AC
Start: 2023-08-01 — End: 2023-08-01
  Administered 2023-08-01: 1500 [IU] via INTRAVENOUS
  Filled 2023-08-01: qty 1500

## 2023-08-01 MED ORDER — ORAL CARE MOUTH RINSE
15.0000 mL | OROMUCOSAL | Status: DC | PRN
Start: 2023-08-01 — End: 2023-08-01

## 2023-08-01 MED ORDER — SODIUM CHLORIDE 0.9 % IV SOLN: 2.0000 g | INTRAVENOUS | Status: DC

## 2023-08-01 NOTE — Consult Note (Signed)
CHIEF COMPLAINT:   Chief Complaint  Patient presents with   Altered Mental Status    Pt believed to have infection     Subjective   62 y.o. 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     Pt was recently hospitalized from 10/28 - 10/31 due to breast cancer with significant metastatic disease to bone and liver.  Dr. Orlie Dakin of oncology is consulted.  Per discharge summary, pt will need mammogram, ultrasound, and biopsy as outpatient. Oncology is to arrange. Pt also has portal vein thrombosis, and was discharged on Eliquis. Per report, wellbeing check was performed today and patient was found to be unresponsive in the bed.  Pt was not able to answer questions initially. Pt was hypotensive and tachycardic with EMS concerning for sepsis.  When I saw pt in ED, she is confused, seem to know her own name, but not orientated to place and time.  She moves all extremities.  She is very dry.  No active respiratory distress, cough, nausea, vomiting, diarrhea noted.  Does not seem to have pain anywhere.  Not sure if patient has symptoms of UTI.   Data reviewed independently and ED Course: pt was found to have WBC 15.0, thrombocytopenia with platelet 89 (platelet was 147 on 07/24/2023), positive urinalysis (cloudy appearance, negative leukocyte, many bacteria, WBC 11-20), negative PCR for COVID, flu and RSV, CKD 1131, calcium 10.9, AKI with creatinine 1.33, BUN 66, GFR 45 (recent baseline creatinine 0.53 on 07/23/2023), abnormal liver function (ALP 172, AST 488, ALT 62, total protein 11.9), hypoglycemia with blood sugar 58, which improved to 178 after giving D D50, lactic acid 5.8 --> 7.0.  CT of head is negative for acute intracranial abnormalities.  Patient is admitted to PCU as inpatient.   Active Ambulatory Problems    Diagnosis Date Noted   Breast  cancer metastasized to bone (HCC) 07/23/2023   Portal vein thrombosis 07/23/2023   Alcohol abuse 07/23/2023   Tobacco abuse 07/23/2023   Vulvar mass 07/23/2023   Hyperbilirubinemia 07/23/2023   Palliative care encounter 07/24/2023   Metastatic malignant neoplasm (HCC) 07/24/2023   Dehydration 08/11/2023   Resolved Ambulatory Problems    Diagnosis Date Noted   No Resolved Ambulatory Problems   No Additional Past Medical History     Objective     REVIEW OF SYSTEMS  PATIENT IS UNABLE TO PROVIDE COMPLETE REVIEW OF SYSTEMS DUE TO SEVERE CRITICAL ILLNESS   PHYSICAL EXAMINATION:  GENERAL:critically ill appearing, +resp distress EYES: Pupils equal, round, reactive to light.  No scleral icterus.  MOUTH: Moist mucosal membrane.  NECK: Supple.  PULMONARY: Lungs clear to auscultation, +rhonchi, +wheezing CARDIOVASCULAR: S1 and S2.  Regular rate and rhythm GASTROINTESTINAL: Soft, nontender, -distended. Positive bowel sounds.  MUSCULOSKELETAL: No swelling, clubbing, or edema.  NEUROLOGIC: obtunded SKIN:normal, warm to touch, Capillary refill delayed  Pulses present bilaterally  VITALS:  height is 5' (1.524 m) and weight is 49.2 kg. Her axillary temperature is 98.5 F (36.9 C). Her blood pressure is 96/59 (abnormal) and her pulse is 107 (abnormal). Her respiration is 36 (abnormal) and oxygen saturation is 93%.   I personally reviewed Labs under Results section.  Radiology Reports CT ABDOMEN PELVIS W CONTRAST  Result Date: 08/05/2023 CLINICAL DATA:  Altered mental status, distended abdomen EXAM: CT ABDOMEN AND PELVIS WITH CONTRAST TECHNIQUE: Multidetector CT imaging of the abdomen and pelvis was  performed using the standard protocol following bolus administration of intravenous contrast. RADIATION DOSE REDUCTION: This exam was performed according to the departmental dose-optimization program which includes automated exposure control, adjustment of the mA and/or kV according to  patient size and/or use of iterative reconstruction technique. CONTRAST:  75mL OMNIPAQUE IOHEXOL 300 MG/ML  SOLN COMPARISON:  07/23/2023 FINDINGS: Lower chest: No pleural or pericardial effusion. Scattered mostly dependent airspace opacities in the lung bases right greater than left. Left breast mass with overlying skin thickening, incompletely visualized. Hepatobiliary: Hepatomegaly with innumerable small low-attenuation lesions throughout the liver, largest 1.8 cm in segment 7. Geographic region of low attenuation in the right and medial left lobes suggesting geographic fatty infiltration, progressive on the right since previous exam. Vicarious excretion of contrast into the lumen of the nondilated gallbladder. No definite biliary ductal dilatation. Pancreas: Unremarkable. No pancreatic ductal dilatation or surrounding inflammatory changes. Spleen: Normal in size without focal abnormality. Adrenals/Urinary Tract: No adrenal mass. Symmetric renal parenchymal enhancement without focal lesion or hydronephrosis. Urinary bladder partially distended. Stomach/Bowel: Stomach is decompressed, without acute finding. Small bowel nondistended. Appendix not localized. Colon incompletely distended with scattered diverticula. Vascular/Lymphatic: Mild scattered aortoiliac calcified atheromatous plaque without AAA. Left para-aortic retroperitoneal adenopathy up to 1.2 cm short axis diameter as before. Reproductive: Calcified uterine fibroids. Prominent fluid attenuation within the endometrium. No adnexal mass. Other: Small volume abdominal and pelvic ascites stable. No free air. Musculoskeletal: Facet DJD L5-S1 allowing grade 1 anterolisthesis. Lucent lesion in the L1 vertebral body involving the left pedicle. Lucent lesion involving the right superior articular process of L2. Lytic lesion involving the first sacral segment. Lytic lesions involving spinous process of T10. Permeative lesions involving right ilium, and left iliac  wing. No definite pathologic fracture. IMPRESSION: 1. No acute findings. 2. Left breast mass with overlying skin thickening, incompletely visualized. 3. Hepatomegaly with innumerable small low-attenuation lesions throughout the liver, largest 1.8 cm in segment 7, consistent with metastatic disease. 4. Left para-aortic retroperitoneal adenopathy, stable. 5. Multiple lytic and permeative osseous lesions, consistent with metastatic disease. 6. Small volume abdominal and pelvic ascites, stable. 7. Scattered mostly dependent airspace opacities in the lung bases right greater than left. 8.  Aortic Atherosclerosis (ICD10-I70.0). Electronically Signed   By: Corlis Leak M.D.   On: 08/18/2023 21:07   DG Chest Port 1 View  Result Date: 08/18/2023 CLINICAL DATA:  Sepsis, found unresponsive EXAM: PORTABLE CHEST 1 VIEW COMPARISON:  None Available. FINDINGS: Single frontal view of the chest demonstrates an unremarkable cardiac silhouette. There is diffuse interstitial prominence throughout the lungs, with patchy bibasilar consolidation favoring atelectasis. No effusion or pneumothorax. Increased soft tissue density overlying the left chest consistent with left breast mass identified on prior CT. No acute fractures. The suspected bony metastasis within the spine and thoracic cage seen on prior CT are less apparent by x-ray. IMPRESSION: 1. Emphysema, with patchy bibasilar consolidation favoring atelectasis. 2. Stable left breast mass as seen on prior CT. Electronically Signed   By: Sharlet Salina M.D.   On: 08/16/2023 20:17   CT Head Wo Contrast  Result Date: 08/03/2023 CLINICAL DATA:  Altered mental status, GCS 10, evaluating intracranial pathology EXAM: CT HEAD WITHOUT CONTRAST TECHNIQUE: Contiguous axial images were obtained from the base of the skull through the vertex without intravenous contrast. RADIATION DOSE REDUCTION: This exam was performed according to the departmental dose-optimization program which includes  automated exposure control, adjustment of the mA and/or kV according to patient size and/or use of iterative reconstruction technique.  COMPARISON:  None Available. FINDINGS: Brain: No hemorrhage. No hydrocephalus. No extra-axial fluid collection. No CT evidence of an acute cortical infarct. No mass effect. No mass lesion. With a background of mild chronic microvascular ischemic change. Vascular: No hyperdense vessel or unexpected calcification. Skull: Normal. Negative for fracture or focal lesion. Sinuses/Orbits: No middle ear or mastoid effusion. Paranasal sinuses are clear. Orbits are unremarkable. Other: None. IMPRESSION: No acute intracranial abnormality. Electronically Signed   By: Lorenza Cambridge M.D.   On: 08/05/2023 19:10       Assessment/Plan:   62 yo AAF with end stage breast cancer with severe acute ametabolic encephalopathy and severe acidosis with acute renal failure with acute resp failure  Patient DNR/DNI status  Patient is in the dying process Recommend Palliative care and comfort care process  Vasopressors will NOT help her in anyway at this time Continue fluids, MAP goal >55    DVT/GI PRX  assessed I Assessed the need for Labs I Assessed the need for Foley I Assessed the need for Central Venous Line Family Discussion when available I Assessed the need for Mobilization I made an Assessment of medications to be adjusted accordingly Safety Risk assessment completed  CASE DISCUSSED IN MULTIDISCIPLINARY ROUNDS WITH ICU TEAM     Critical Care Time devoted to patient care services described in this note is 65 minutes.  Critical care was necessary to treat /prevent imminent and life-threatening deterioration. Overall, patient is critically ill, prognosis is guarded.  Patient with Multiorgan failure and at high risk for cardiac arrest and death.    Lucie Leather, M.D.  [facility]  Medical Director [facility] Medical Director  [facility] Cardio-Pulmonary Department

## 2023-08-01 NOTE — Progress Notes (Signed)
Cliffton Asters, NP notified of CBG of 72, despite D5NS @ 100 and to verify if another lactic was to be checked due to continued increase in lactics. Order to recheck CBG Q2. Per NP, no need to continue to recheck lactic at this time.   2040 - Provider notified that family is at bedside.   2127 - Spoke with pt's niece regarding POC. Pt's niece requesting to transition to comfort care around 2200.   2215 - Pt transitioned to comfort care per pt's niece request. Pt niece remained at bedside. IV pumps turned off and disconnect. Removed HHFNC. PRN morphine administered per EMAR/order. No further questions/concerns from pt's niece at this time.

## 2023-08-01 NOTE — Progress Notes (Addendum)
Jovani.Hurl- MD Carla Drape notified of patients vital signs 89/48 MAP 61, RR 40, O2 91% on 15L Venturi mask.  Teagan.Held- MD responded- no new orders at this time

## 2023-08-01 NOTE — Plan of Care (Signed)
  Problem: Education: Goal: Knowledge of General Education information will improve Description: Including pain rating scale, medication(s)/side effects and non-pharmacologic comfort measures Outcome: Not Progressing   Problem: Health Behavior/Discharge Planning: Goal: Ability to manage health-related needs will improve Outcome: Not Progressing   Problem: Clinical Measurements: Goal: Ability to maintain clinical measurements within normal limits will improve Outcome: Not Progressing Goal: Will remain free from infection Outcome: Not Progressing Goal: Diagnostic test results will improve Outcome: Not Progressing Goal: Respiratory complications will improve Outcome: Not Progressing Goal: Cardiovascular complication will be avoided Outcome: Not Progressing   Problem: Activity: Goal: Risk for activity intolerance will decrease Outcome: Not Progressing   Problem: Nutrition: Goal: Adequate nutrition will be maintained Outcome: Not Progressing   Problem: Coping: Goal: Level of anxiety will decrease Outcome: Not Progressing   Problem: Elimination: Goal: Will not experience complications related to bowel motility Outcome: Not Progressing Goal: Will not experience complications related to urinary retention Outcome: Not Progressing   Problem: Pain Management: Goal: General experience of comfort will improve Outcome: Not Progressing   Problem: Safety: Goal: Ability to remain free from injury will improve Outcome: Not Progressing   Problem: Skin Integrity: Goal: Risk for impaired skin integrity will decrease Outcome: Not Progressing  Pt resting in bed on HHFNC upon shift change. Soft BP. ST in low 100's. Heparin drip and D5NS per EMAR/orders. Pt minimally responsive, opens eyes to painful stimulation, but does not maintain open eyes, nor does pt track. Pt transitioned to comfort care after family thoroughly updated by providers. Family at bedside. PRN Morphine administered per  EMAR/orders. Family remains at bedside. Bed in lowest position with bed alarms on and call bell within reach.

## 2023-08-01 NOTE — Progress Notes (Signed)
PHARMACY - ANTICOAGULATION CONSULT NOTE  Pharmacy Consult for Heparin  Indication:  portal vein thrombosis   No Known Allergies  Patient Measurements: Height: 5' (152.4 cm) Weight: 49.2 kg (108 lb 8 oz) IBW/kg (Calculated) : 45.5 Heparin Dosing Weight: 49.2 kg   Vital Signs: Temp: 98 F (36.7 C) (11/06 1930) Temp Source: Axillary (11/06 1930) BP: 88/51 (11/06 2030) Pulse Rate: 105 (11/06 2030)  Labs: Recent Labs    08/03/2023 1727 08/09/2023 2235 08/01/23 0639 08/01/23 1420 08/01/23 2023  HGB 11.1*  --  9.8*  --   --   HCT 34.8*  --  30.4*  --   --   PLT 89*  --  89*  --   --   APTT 42*  --   --   --   --   LABPROT 18.5*  --   --   --   --   INR 1.5*  --   --   --   --   HEPARINUNFRC  --    < > 0.12* 0.33 0.33  CREATININE 1.33*  --  1.54*  --   --   CKTOTAL 1,131*  --  1,134*  --   --    < > = values in this interval not displayed.    Estimated Creatinine Clearance: 27.2 mL/min (A) (by C-G formula based on SCr of 1.54 mg/dL (H)).   Medical History: No past medical history on file.  Medications:  Medications Prior to Admission  Medication Sig Dispense Refill Last Dose   apixaban (ELIQUIS) 5 MG TABS tablet Take 2 tablets (10mg ) twice daily for 7 days, then 1 tablet (5mg ) twice daily 60 tablet 0 Unknown at Unknown   feeding supplement (ENSURE ENLIVE / ENSURE PLUS) LIQD Take 237 mLs by mouth 2 (two) times daily between meals.      folic acid (FOLVITE) 1 MG tablet Take 1 tablet (1 mg total) by mouth daily.   Unknown at Unknown   Multiple Vitamin (MULTIVITAMIN WITH MINERALS) TABS tablet Take 1 tablet by mouth daily.   Unknown at Unknown   oxyCODONE (OXY IR/ROXICODONE) 5 MG immediate release tablet Take 1 tablet (5 mg total) by mouth every 6 (six) hours as needed for moderate pain (pain score 4-6) or severe pain (pain score 7-10). 20 tablet 0 prn at prn   thiamine (VITAMIN B-1) 100 MG tablet Take 1 tablet (100 mg total) by mouth daily.   Unknown at Unknown    Assessment: Pharmacy consulted to dose heparin in this 62 year old female admitted with AMS, NPO.  Pt was on Eliquis 5 mg PO BID PTA for portal vein thrombosis, unsure of last dose. CrCl = 31.5 ml/min   Labs: 11/5: Baseline HL = < 0.1 11/6: HL @ 0632 = 0.12, SUBtherapeutic  11/6: HL @ 1420 = 0.33, therapeutic x 1  11/6: HL @ 2023 = 0.33, therapeutic x 1  Goal of Therapy:  Heparin level 0.3-0.7 units/ml aPTT 66 - 102  seconds Monitor platelets by anticoagulation protocol: Yes   Plan:  HL therapeutic x 2, can transition to daily monitoring Continue heparin infusion at 950 units/hr  Monitor CBC daily while on heparin   Merryl Hacker, PharmD Clinical Pharmacist 08/01/2023 9:03 PM

## 2023-08-01 NOTE — ED Notes (Signed)
Humidity placed to [place]. Noted to be in the mid 80's. Patient sleeping. O2 titrated to 4L. Will continue to monitor.

## 2023-08-01 NOTE — Progress Notes (Signed)
PHARMACY - ANTICOAGULATION CONSULT NOTE  Pharmacy Consult for Heparin  Indication:  portal vein thrombosis   No Known Allergies  Patient Measurements: Height: 5' (152.4 cm) Weight: 49.2 kg (108 lb 8 oz) IBW/kg (Calculated) : 45.5 Heparin Dosing Weight: 49.2 kg   Vital Signs: Temp: 98.5 F (36.9 C) (11/06 0253) Temp Source: Axillary (11/06 0253) BP: 89/48 (11/06 0700) Pulse Rate: 110 (11/06 0700)  Labs: Recent Labs    08/22/2023 1727 08/07/2023 2235 08/01/23 0639  HGB 11.1*  --  9.8*  HCT 34.8*  --  30.4*  PLT 89*  --  89*  APTT 42*  --   --   LABPROT 18.5*  --   --   INR 1.5*  --   --   HEPARINUNFRC  --  <0.10* 0.12*  CREATININE 1.33*  --   --   CKTOTAL 1,131*  --   --     Estimated Creatinine Clearance: 31.5 mL/min (A) (by C-G formula based on SCr of 1.33 mg/dL (H)).   Medical History: No past medical history on file.  Medications:  Medications Prior to Admission  Medication Sig Dispense Refill Last Dose   apixaban (ELIQUIS) 5 MG TABS tablet Take 2 tablets (10mg ) twice daily for 7 days, then 1 tablet (5mg ) twice daily 60 tablet 0 Unknown at Unknown   feeding supplement (ENSURE ENLIVE / ENSURE PLUS) LIQD Take 237 mLs by mouth 2 (two) times daily between meals.      folic acid (FOLVITE) 1 MG tablet Take 1 tablet (1 mg total) by mouth daily.   Unknown at Unknown   Multiple Vitamin (MULTIVITAMIN WITH MINERALS) TABS tablet Take 1 tablet by mouth daily.   Unknown at Unknown   oxyCODONE (OXY IR/ROXICODONE) 5 MG immediate release tablet Take 1 tablet (5 mg total) by mouth every 6 (six) hours as needed for moderate pain (pain score 4-6) or severe pain (pain score 7-10). 20 tablet 0 prn at prn   thiamine (VITAMIN B-1) 100 MG tablet Take 1 tablet (100 mg total) by mouth daily.   Unknown at Unknown    Assessment: Pharmacy consulted to dose heparin in this 62 year old female admitted with AMS, NPO.  Pt was on Eliquis 5 mg PO BID PTA for portal vein thrombosis, unsure of last  dose. CrCl = 31.5 ml/min   11/5:  Baseline HL = < 0.1 11/6: HL @ 0632 = 0.12, SUBtherapeutic   Goal of Therapy:  Heparin level 0.3-0.7 units/ml aPTT 66 - 102  seconds Monitor platelets by anticoagulation protocol: Yes   Plan:  - Will order baseline HL to r/o Eliquis PTA.  Start heparin infusion at 800 units/hr  11/5:  HL (baseline) @ 2235 = < 0.1 - pt does not appear to have taken Eliquis recently so will use HL to guide dosing - Will draw HL 6 hrs after start of drip   11/6:  HL @ 0632 = 0.12, SUBtherapeutic - Will order heparin 3000 units IV X 1 bolus and increase drip rate to 950 units/hr. - Will recheck HL 6 hrs after rate change   Alysen Smylie D 08/01/2023,7:13 AM

## 2023-08-01 NOTE — Progress Notes (Signed)
PHARMACY - ANTICOAGULATION CONSULT NOTE  Pharmacy Consult for Heparin  Indication:  portal vein thrombosis   No Known Allergies  Patient Measurements: Height: 5' (152.4 cm) Weight: 49.2 kg (108 lb 8 oz) IBW/kg (Calculated) : 45.5 Heparin Dosing Weight: 49.2 kg   Vital Signs: Temp: 97.8 F (36.6 C) (11/06 1200) Temp Source: Axillary (11/06 1200) BP: 86/49 (11/06 1400) Pulse Rate: 106 (11/06 1400)  Labs: Recent Labs    07/30/2023 1727 08/25/2023 2235 08/01/23 0639 08/01/23 1420  HGB 11.1*  --  9.8*  --   HCT 34.8*  --  30.4*  --   PLT 89*  --  89*  --   APTT 42*  --   --   --   LABPROT 18.5*  --   --   --   INR 1.5*  --   --   --   HEPARINUNFRC  --  <0.10* 0.12* 0.33  CREATININE 1.33*  --  1.54*  --   CKTOTAL 1,131*  --  1,134*  --     Estimated Creatinine Clearance: 27.2 mL/min (A) (by C-G formula based on SCr of 1.54 mg/dL (H)).   Medical History: No past medical history on file.  Medications:  Medications Prior to Admission  Medication Sig Dispense Refill Last Dose   apixaban (ELIQUIS) 5 MG TABS tablet Take 2 tablets (10mg ) twice daily for 7 days, then 1 tablet (5mg ) twice daily 60 tablet 0 Unknown at Unknown   feeding supplement (ENSURE ENLIVE / ENSURE PLUS) LIQD Take 237 mLs by mouth 2 (two) times daily between meals.      folic acid (FOLVITE) 1 MG tablet Take 1 tablet (1 mg total) by mouth daily.   Unknown at Unknown   Multiple Vitamin (MULTIVITAMIN WITH MINERALS) TABS tablet Take 1 tablet by mouth daily.   Unknown at Unknown   oxyCODONE (OXY IR/ROXICODONE) 5 MG immediate release tablet Take 1 tablet (5 mg total) by mouth every 6 (six) hours as needed for moderate pain (pain score 4-6) or severe pain (pain score 7-10). 20 tablet 0 prn at prn   thiamine (VITAMIN B-1) 100 MG tablet Take 1 tablet (100 mg total) by mouth daily.   Unknown at Unknown   Assessment: Pharmacy consulted to dose heparin in this 62 year old female admitted with AMS, NPO.  Pt was on Eliquis  5 mg PO BID PTA for portal vein thrombosis, unsure of last dose. CrCl = 31.5 ml/min   Labs: 11/5: Baseline HL = < 0.1 11/6: HL @ 0632 = 0.12, SUBtherapeutic  11/6: HL @ 1420 = 0.33, therapeutic x 1   Goal of Therapy:  Heparin level 0.3-0.7 units/ml aPTT 66 - 102  seconds Monitor platelets by anticoagulation protocol: Yes   Plan:  HL therapeutic x 1  Continue heparin infusion at 950 units/hr  Recheck HL in 6 hours at 2030 Monitor CBC daily while on heparin   Littie Deeds, PharmD Pharmacy Resident  08/01/2023 3:00 PM

## 2023-08-01 NOTE — Progress Notes (Signed)
This RN just took report on patient. Patient to receive Bolus, Beth,RN stated Dr.Niu gave a verbal order that "if patient RR goes up while getting Bolus to stop them".   [place] Iszabella Hebenstreit,RN

## 2023-08-01 NOTE — Progress Notes (Signed)
       CROSS COVER NOTE  NAME: Annalyce Lanpher MRN: [id number] DOB : September 14, 1961    Concern as stated by nurse / staff    Patient family member, cousin Misty Stanley at bedside     Pertinent findings on chart review: Cherylynn Liszewski is a 62 y.o. female with medical history significant of Breast cancer metastasized to bone and liver, tobacco abuse, alcohol use, portal vein thrombosis, vulvar mass .  She  was hospitalized 07/23/2023 to 07/25/2023 with weakness and back pain and was diagnosed with her  widespread metastatic process involving the breast, bone, and liver.  Plan was for outpatient follow-up and workup.  However, patient readmitted 08/12/2023 with septic shock with multisytem organ failure and rhabdomyolysis after being found unresponsive at home after a wellness check. Patient prior code status DNR. Patient without improvement in mental status or organ function despite fluid support and antibiotics. Only family member of patient to be able to discuss care going forward is Kennyth Arnold who is at bedside.  Misty Stanley tells me patient is one of four siblings. One sister is in nursing home in [place] and has cognitive issues. Misty Stanley is daughter of the patients brother. Neither of patients sisters had children.  Patients current state and actively dying process explained to [facility] and options of care going forward explained. Misty Stanley would like to initiate comfort measures only.  Assessment and  Interventions   Assessment:  Plan: Comfort measures ordered      Donnie Mesa NP [facility] Cross Cover 7pm-7am - check amion for availability Pager [phone]

## 2023-08-01 NOTE — Progress Notes (Signed)
PROGRESS NOTE  Carrie Jones    DOB: May 26, 1961, 62 y.o.  ZOX:[id number]    Code Status: Limited: Do not attempt resuscitation (DNR) -DNR-LIMITED -Do Not Intubate/DNI    DOA: 08/10/2023   LOS: 1   Brief hospital course  Carrie Jones is a 62 y.o. 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  Patient presented with unresponsive altered mental status, cannot provide any medical history. All information collected from chart review and report of first responders. wellbeing check was performed today and patient was found to be unresponsive in the bed. Pt was hypotensive and tachycardic with EMS concerning for sepsis.   Pt was recently hospitalized from 10/28 - 10/31 due to breast cancer with significant metastatic disease to bone and liver.  Dr. Orlie Dakin of oncology is consulted. Pt also has portal vein thrombosis, and was discharged on Eliquis.    ED Course: WBC 15.0, thrombocytopenia with platelet 89 (platelet was 147 on 07/24/2023), positive urinalysis (cloudy appearance, negative leukocyte, many bacteria, WBC 11-20), negative PCR for COVID, flu and RSV, CKD 1131, calcium 10.9, AKI with creatinine 1.33, BUN 66, GFR 45 (recent baseline creatinine 0.53 on 07/23/2023), abnormal liver function (ALP 172, AST 488, ALT 62, total protein 11.9), hypoglycemia with blood sugar 58, which improved to 178 after giving D D50, lactic acid 5.8 --> 7.0.  CT of head is negative for acute intracranial abnormalities. Chest x-ray: 1. Emphysema, with patchy bibasilar consolidation favoring  atelectasis. 2. Stable left breast mass as seen on prior CT. CT of abdomen/pelvis: 1. No acute findings. 2. Left breast mass with overlying skin thickening, incompletely visualized. 3. Hepatomegaly with innumerable small low-attenuation lesions throughout the liver, largest 1.8 cm in segment 7,  consistent with metastatic disease. 4. Left para-aortic retroperitoneal adenopathy, stable. 5. Multiple lytic and permeative osseous lesions, consistent with metastatic disease. 6. Small volume abdominal and pelvic ascites, stable. 7. Scattered mostly dependent airspace opacities in the lung bases right greater than left. 8.  Aortic Atherosclerosis (ICD10-I70.0).   EKG: Sinus rhythm, QTc 445, LAD, low voltage, early R wave progression.  They were initially treated with empiric Abx, IV fluid boluses.   Patient was admitted to medicine service for further workup and management of severe sepsis with end organ damage as outlined in detail below.  08/01/23 -critical condition.   Assessment & Plan  Principal Problem:   Severe sepsis with septic shock (HCC) Active Problems:   UTI (urinary tract infection)   Acute metabolic encephalopathy   Rhabdomyolysis   Portal vein thrombosis   Abnormal LFTs   Hypercalcemia   Hypoglycemia   Thrombocytopenia (HCC)   AKI (acute kidney injury) (HCC)   Alcohol abuse   Tobacco abuse   Breast cancer metastasized to bone (HCC)   Vulvar mass  Acute hypoxic respiratory distress Metabolic encephalopathy Severe sepsis with septic shock (CODE): Patient meets criteria for severe sepsis versus septic shock, with WBC 15.0, heart rate 107, RR 35, lactic acid 5.8 > 7>7.8.  MAP 60-70.  ABG: pH 7.33, pCO2 26, O2 62, bicarb 13.7 Source likely pulmonary given respiratory distress. Blood and urine cultures pending. She is confirmed DNR so would not intubate at this time and she in not alert for bipap.  - continue supportive respiratory care - morphine for air hunger - continue empiric Abx - IVF support as well as pressors if not maintaining MAP >65 - CCM consult - palliative consulted. Condition  is critical and if not responding to treatment, would consider comfort measures. Family has been notified of her condition and asked to come to bedside    Rhabdomyolysis: CK1131 and stable -IV fluid as above -Repeat CK in morning   Portal vein thrombosis: Patient is on Eliquis -Switch to IV heparin since patient cannot take PO    Hypoglycemia- on d5 supplement IVF -As needed D50 -Check blood sugar level every 4 hours   Thrombocytopenia (HCC): Platelet 89 and stable- acute action response No acute bleeding   AKI- worsening today Cr 1.33>1.54 -On IV fluid as above   Alcohol abuse and Tobacco abuse -Nicotine patch -CIWA protocol   Breast cancer metastasized to bone and liver -Palliative consulted  Body mass index is 21.19 kg/m.  VTE ppx: heparin  Diet:     Diet   Diet NPO time specified   Consultants: CCM Palliative   Subjective 08/01/23    Pt reports nothing. She is not responsive   Objective   Vitals:   08/01/23 0500 08/01/23 0600 08/01/23 0700 08/01/23 0800  BP: (!) 90/47 (!) 88/46 (!) 89/48 (!) 95/56  Pulse: (!) 108 (!) 107 (!) 110 (!) 107  Resp: (!) 39 (!) 44 (!) 30 (!) 40  Temp:      TempSrc:      SpO2: 93% 91% 94% 93%  Weight:      Height:        Intake/Output Summary (Last 24 hours) at 08/01/2023 0857 Last data filed at 08/01/2023 0630 Gross per 24 hour  Intake 4417.56 ml  Output 20 ml  Net 4397.56 ml   Filed Weights   08/16/2023 1711  Weight: 49.2 kg     Physical Exam:  General: unresponsive, NAD HEENT: atraumatic, clear conjunctiva, anicteric sclera Respiratory: increased respiratory effort. Decreased breath sounds, negative wheezing Cardiovascular: quick capillary refill, normal S1/S2, RRR, no JVD, murmurs Gastrointestinal: soft, ND Nervous: not responding to painful stimuli Extremities: moves all equally, no edema, normal tone Skin: dry, intact, normal temperature, normal color.   Labs   I have personally reviewed the following labs and imaging studies CBC    Component Value Date/Time   WBC 13.5 (H) 08/01/2023 0639   RBC 3.30 (L) 08/01/2023 0639   HGB 9.8 (L) 08/01/2023 0639    HCT 30.4 (L) 08/01/2023 0639   PLT 89 (L) 08/01/2023 0639   MCV 92.1 08/01/2023 0639   MCH 29.7 08/01/2023 0639   MCHC 32.2 08/01/2023 0639   RDW 23.6 (H) 08/01/2023 0639   LYMPHSABS 0.8 08/04/2023 1727   MONOABS 1.0 08/23/2023 1727   EOSABS 0.0 08/05/2023 1727   BASOSABS 0.1 08/08/2023 1727      Latest Ref Rng & Units 08/01/2023    6:39 AM 08/01/2023    5:27 PM 07/25/2023    4:19 AM  BMP  Glucose 70 - 99 mg/dL 75  58  578   BUN 8 - 23 mg/dL 67  66  33   Creatinine 0.44 - 1.00 mg/dL 4.69  6.29  5.28   Sodium 135 - 145 mmol/L 144  147  135   Potassium 3.5 - 5.1 mmol/L 4.1  3.8  3.8   Chloride 98 - 111 mmol/L 111  112  102   CO2 22 - 32 mmol/L 15  17  21    Calcium 8.9 - 10.3 mg/dL 41.3  24.4  9.2     CT ABDOMEN PELVIS W CONTRAST  Result Date: 08/23/2023 CLINICAL DATA:  Altered mental status, distended abdomen  EXAM: CT ABDOMEN AND PELVIS WITH CONTRAST TECHNIQUE: Multidetector CT imaging of the abdomen and pelvis was performed using the standard protocol following bolus administration of intravenous contrast. RADIATION DOSE REDUCTION: This exam was performed according to the departmental dose-optimization program which includes automated exposure control, adjustment of the mA and/or kV according to patient size and/or use of iterative reconstruction technique. CONTRAST:  75mL OMNIPAQUE IOHEXOL 300 MG/ML  SOLN COMPARISON:  07/23/2023 FINDINGS: Lower chest: No pleural or pericardial effusion. Scattered mostly dependent airspace opacities in the lung bases right greater than left. Left breast mass with overlying skin thickening, incompletely visualized. Hepatobiliary: Hepatomegaly with innumerable small low-attenuation lesions throughout the liver, largest 1.8 cm in segment 7. Geographic region of low attenuation in the right and medial left lobes suggesting geographic fatty infiltration, progressive on the right since previous exam. Vicarious excretion of contrast into the lumen of the  nondilated gallbladder. No definite biliary ductal dilatation. Pancreas: Unremarkable. No pancreatic ductal dilatation or surrounding inflammatory changes. Spleen: Normal in size without focal abnormality. Adrenals/Urinary Tract: No adrenal mass. Symmetric renal parenchymal enhancement without focal lesion or hydronephrosis. Urinary bladder partially distended. Stomach/Bowel: Stomach is decompressed, without acute finding. Small bowel nondistended. Appendix not localized. Colon incompletely distended with scattered diverticula. Vascular/Lymphatic: Mild scattered aortoiliac calcified atheromatous plaque without AAA. Left para-aortic retroperitoneal adenopathy up to 1.2 cm short axis diameter as before. Reproductive: Calcified uterine fibroids. Prominent fluid attenuation within the endometrium. No adnexal mass. Other: Small volume abdominal and pelvic ascites stable. No free air. Musculoskeletal: Facet DJD L5-S1 allowing grade 1 anterolisthesis. Lucent lesion in the L1 vertebral body involving the left pedicle. Lucent lesion involving the right superior articular process of L2. Lytic lesion involving the first sacral segment. Lytic lesions involving spinous process of T10. Permeative lesions involving right ilium, and left iliac wing. No definite pathologic fracture. IMPRESSION: 1. No acute findings. 2. Left breast mass with overlying skin thickening, incompletely visualized. 3. Hepatomegaly with innumerable small low-attenuation lesions throughout the liver, largest 1.8 cm in segment 7, consistent with metastatic disease. 4. Left para-aortic retroperitoneal adenopathy, stable. 5. Multiple lytic and permeative osseous lesions, consistent with metastatic disease. 6. Small volume abdominal and pelvic ascites, stable. 7. Scattered mostly dependent airspace opacities in the lung bases right greater than left. 8.  Aortic Atherosclerosis (ICD10-I70.0). Electronically Signed   By: Corlis Leak M.D.   On: 08/04/2023 21:07    DG Chest Port 1 View  Result Date: 08/23/2023 CLINICAL DATA:  Sepsis, found unresponsive EXAM: PORTABLE CHEST 1 VIEW COMPARISON:  None Available. FINDINGS: Single frontal view of the chest demonstrates an unremarkable cardiac silhouette. There is diffuse interstitial prominence throughout the lungs, with patchy bibasilar consolidation favoring atelectasis. No effusion or pneumothorax. Increased soft tissue density overlying the left chest consistent with left breast mass identified on prior CT. No acute fractures. The suspected bony metastasis within the spine and thoracic cage seen on prior CT are less apparent by x-ray. IMPRESSION: 1. Emphysema, with patchy bibasilar consolidation favoring atelectasis. 2. Stable left breast mass as seen on prior CT. Electronically Signed   By: Sharlet Salina M.D.   On: 08/05/2023 20:17   CT Head Wo Contrast  Result Date: 08/01/2023 CLINICAL DATA:  Altered mental status, GCS 10, evaluating intracranial pathology EXAM: CT HEAD WITHOUT CONTRAST TECHNIQUE: Contiguous axial images were obtained from the base of the skull through the vertex without intravenous contrast. RADIATION DOSE REDUCTION: This exam was performed according to the departmental dose-optimization program which includes automated exposure control,  adjustment of the mA and/or kV according to patient size and/or use of iterative reconstruction technique. COMPARISON:  None Available. FINDINGS: Brain: No hemorrhage. No hydrocephalus. No extra-axial fluid collection. No CT evidence of an acute cortical infarct. No mass effect. No mass lesion. With a background of mild chronic microvascular ischemic change. Vascular: No hyperdense vessel or unexpected calcification. Skull: Normal. Negative for fracture or focal lesion. Sinuses/Orbits: No middle ear or mastoid effusion. Paranasal sinuses are clear. Orbits are unremarkable. Other: None. IMPRESSION: No acute intracranial abnormality. Electronically Signed   By:  Lorenza Cambridge M.D.   On: 08/14/2023 19:10    Disposition Plan & Communication  Patient status: Inpatient  Admitted From: Home Planned disposition location: TBD, poor prognosis. Possible hospital death Anticipated discharge date: TBD  Family Communication: palliative spoke with cousin    Author: Leeroy Bock, DO [facility] 08/01/2023, 8:57 AM   Available by Epic secure chat 7AM-7PM. If 7PM-7AM, please contact night-coverage.  [facility] contact information found on [email or web address].

## 2023-08-01 NOTE — Consult Note (Signed)
Palliative Medicine [facility] Telephone:([phone]) [phone] Fax:(336) [phone]   Name: Carrie Jones Date: 08/01/2023 MRN: [id number]  DOB: 21-Aug-1961  Patient Care Team: Patient, No Pcp Per as PCP - General (General Practice)    REASON FOR CONSULTATION: Carrie Jones is a 62 y.o. female with multiple medical problems including alcohol and tobacco use who was hospitalized 07/23/2023 to 07/25/2023 with weakness and back pain and found to have widespread metastatic process involving the breast, bone, and liver.  Plan was for outpatient follow-up and workup.  However, patient readmitted 08/24/2023 with severe sepsis after being found unresponsive at home after a wellness check.  Palliative care was consulted to address goals.  SOCIAL HISTORY:     reports that she has been smoking cigarettes. She has never used smokeless tobacco. She reports current alcohol use. She reports that she does not use drugs.  Patient is unmarried and has no children.  She lives at home alone.  She has a sister who is a resident of a skilled nursing facility in [place].  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  ADVANCE DIRECTIVES:  Does not have  CODE STATUS: DNR  PAST MEDICAL HISTORY:No past medical history on file.  PAST SURGICAL HISTORY: No past surgical history on file.  HEMATOLOGY/ONCOLOGY HISTORY:  Oncology History   No history exists.    ALLERGIES:  has No Known Allergies.  MEDICATIONS:  Current Facility-Administered Medications  Medication Dose Route Frequency Provider Last Rate Last Admin   albuterol (PROVENTIL) (2.5 MG/3ML) 0.083% nebulizer solution 2.5 mg  2.5 mg Nebulization Q4H PRN Lorretta Harp, MD       ceFEPIme (MAXIPIME) 2 g in sodium chloride 0.9 % 100 mL IVPB  2 g Intravenous Q12H Lorretta Harp, MD   Stopped at 08/01/23 0630   Chlorhexidine Gluconate Cloth 2 % PADS 6 each  6 each Topical Daily Lorretta Harp, MD   6 each at 08/01/23 0926   dextrose 5  %-0.9 % sodium chloride infusion   Intravenous Continuous Leeroy Bock, MD 100 mL/hr at 08/01/23 1000 Infusion Verify at 08/01/23 1000   dextrose 50 % solution 50 mL  50 mL Intravenous PRN Lorretta Harp, MD       folic acid (FOLVITE) tablet 1 mg  1 mg Oral Daily Lorretta Harp, MD       heparin ADULT infusion 100 units/mL (25000 units/247mL)  950 Units/hr Intravenous Continuous Jamelle Rushing L, MD 9.5 mL/hr at 08/01/23 1000 950 Units/hr at 08/01/23 1000   ibuprofen (ADVIL) tablet 200 mg  200 mg Oral Q6H PRN Lorretta Harp, MD       LORazepam (ATIVAN) injection 0-4 mg  0-4 mg Intravenous Q6H Lorretta Harp, MD       Followed by   Melene Muller ON 08/25/2023] LORazepam (ATIVAN) injection 0-4 mg  0-4 mg Intravenous Q12H Lorretta Harp, MD       LORazepam (ATIVAN) tablet 1-4 mg  1-4 mg Oral Q1H PRN Lorretta Harp, MD       Or   LORazepam (ATIVAN) injection 1-4 mg  1-4 mg Intravenous Q1H PRN Lorretta Harp, MD   2 mg at 08/15/2023 2303   metroNIDAZOLE (FLAGYL) IVPB 500 mg  500 mg Intravenous Q12H Lorretta Harp, MD   Stopped at 08/01/23 0857   morphine (PF) 2 MG/ML injection 1 mg  1 mg Intravenous Q1H PRN Leeroy Bock, MD   1 mg at 08/01/23 1309   multivitamin with minerals tablet 1 tablet  1  tablet Oral Daily Lorretta Harp, MD       nicotine (NICODERM CQ - dosed in mg/24 hours) patch 21 mg  21 mg Transdermal Daily Lorretta Harp, MD   21 mg at 08/01/23 0940   ondansetron (ZOFRAN) injection 4 mg  4 mg Intravenous Q8H PRN Lorretta Harp, MD       oxyCODONE (Oxy IR/ROXICODONE) immediate release tablet 5 mg  5 mg Oral Q6H PRN Lorretta Harp, MD       sodium chloride flush (NS) 0.9 % injection 10 mL  10 mL Intravenous Q12H Janith Lima, MD   10 mL at 08/01/23 4540   thiamine (VITAMIN B1) tablet 100 mg  100 mg Oral Daily Lorretta Harp, MD       Or   thiamine (VITAMIN B1) injection 100 mg  100 mg Intravenous Daily Lorretta Harp, MD   100 mg at 08/01/23 0938   [START ON 08/23/2023] vancomycin (VANCOCIN) IVPB 1000 mg/200 mL premix  1,000 mg  Intravenous Q48H Lorretta Harp, MD        VITAL SIGNS: BP (!) 82/53   Pulse (!) 105   Temp 97.8 F (36.6 C) (Axillary)   Resp (!) 32   Ht 5' (1.524 m)   Wt 108 lb 8 oz (49.2 kg)   SpO2 91%   BMI 21.19 kg/m  Filed Weights   07/28/2023 1711  Weight: 108 lb 8 oz (49.2 kg)    Estimated body mass index is 21.19 kg/m as calculated from the following:   Height as of this encounter: 5' (1.524 m).   Weight as of this encounter: 108 lb 8 oz (49.2 kg).  LABS: CBC:    Component Value Date/Time   WBC 13.5 (H) 08/01/2023 0639   HGB 9.8 (L) 08/01/2023 0639   HCT 30.4 (L) 08/01/2023 0639   PLT 89 (L) 08/01/2023 0639   MCV 92.1 08/01/2023 0639   NEUTROABS 13.0 (H) 08/13/2023 1727   LYMPHSABS 0.8 08/16/2023 1727   MONOABS 1.0 08/18/2023 1727   EOSABS 0.0 08/10/2023 1727   BASOSABS 0.1 08/08/2023 1727   Comprehensive Metabolic Panel:    Component Value Date/Time   NA 144 08/01/2023 0639   K 4.1 08/01/2023 0639   CL 111 08/01/2023 0639   CO2 15 (L) 08/01/2023 0639   BUN 67 (H) 08/01/2023 0639   CREATININE 1.54 (H) 08/01/2023 0639   GLUCOSE 75 08/01/2023 0639   CALCIUM 10.1 08/01/2023 0639   AST 537 (H) 08/01/2023 0639   ALT 66 (H) 08/01/2023 0639   ALKPHOS 144 (H) 08/01/2023 0639   BILITOT 11.1 (H) 08/01/2023 0639   PROT 6.3 (L) 08/01/2023 0639   ALBUMIN 1.9 (L) 08/01/2023 0639    RADIOGRAPHIC STUDIES: CT ABDOMEN PELVIS W CONTRAST  Result Date: 08/04/2023 CLINICAL DATA:  Altered mental status, distended abdomen EXAM: CT ABDOMEN AND PELVIS WITH CONTRAST TECHNIQUE: Multidetector CT imaging of the abdomen and pelvis was performed using the standard protocol following bolus administration of intravenous contrast. RADIATION DOSE REDUCTION: This exam was performed according to the departmental dose-optimization program which includes automated exposure control, adjustment of the mA and/or kV according to patient size and/or use of iterative reconstruction technique. CONTRAST:  75mL  OMNIPAQUE IOHEXOL 300 MG/ML  SOLN COMPARISON:  07/23/2023 FINDINGS: Lower chest: No pleural or pericardial effusion. Scattered mostly dependent airspace opacities in the lung bases right greater than left. Left breast mass with overlying skin thickening, incompletely visualized. Hepatobiliary: Hepatomegaly with innumerable small low-attenuation lesions throughout the liver, largest 1.8 cm  in segment 7. Geographic region of low attenuation in the right and medial left lobes suggesting geographic fatty infiltration, progressive on the right since previous exam. Vicarious excretion of contrast into the lumen of the nondilated gallbladder. No definite biliary ductal dilatation. Pancreas: Unremarkable. No pancreatic ductal dilatation or surrounding inflammatory changes. Spleen: Normal in size without focal abnormality. Adrenals/Urinary Tract: No adrenal mass. Symmetric renal parenchymal enhancement without focal lesion or hydronephrosis. Urinary bladder partially distended. Stomach/Bowel: Stomach is decompressed, without acute finding. Small bowel nondistended. Appendix not localized. Colon incompletely distended with scattered diverticula. Vascular/Lymphatic: Mild scattered aortoiliac calcified atheromatous plaque without AAA. Left para-aortic retroperitoneal adenopathy up to 1.2 cm short axis diameter as before. Reproductive: Calcified uterine fibroids. Prominent fluid attenuation within the endometrium. No adnexal mass. Other: Small volume abdominal and pelvic ascites stable. No free air. Musculoskeletal: Facet DJD L5-S1 allowing grade 1 anterolisthesis. Lucent lesion in the L1 vertebral body involving the left pedicle. Lucent lesion involving the right superior articular process of L2. Lytic lesion involving the first sacral segment. Lytic lesions involving spinous process of T10. Permeative lesions involving right ilium, and left iliac wing. No definite pathologic fracture. IMPRESSION: 1. No acute findings. 2. Left  breast mass with overlying skin thickening, incompletely visualized. 3. Hepatomegaly with innumerable small low-attenuation lesions throughout the liver, largest 1.8 cm in segment 7, consistent with metastatic disease. 4. Left para-aortic retroperitoneal adenopathy, stable. 5. Multiple lytic and permeative osseous lesions, consistent with metastatic disease. 6. Small volume abdominal and pelvic ascites, stable. 7. Scattered mostly dependent airspace opacities in the lung bases right greater than left. 8.  Aortic Atherosclerosis (ICD10-I70.0). Electronically Signed   By: Corlis Leak M.D.   On: 08/16/2023 21:07   DG Chest Port 1 View  Result Date: 08/06/2023 CLINICAL DATA:  Sepsis, found unresponsive EXAM: PORTABLE CHEST 1 VIEW COMPARISON:  None Available. FINDINGS: Single frontal view of the chest demonstrates an unremarkable cardiac silhouette. There is diffuse interstitial prominence throughout the lungs, with patchy bibasilar consolidation favoring atelectasis. No effusion or pneumothorax. Increased soft tissue density overlying the left chest consistent with left breast mass identified on prior CT. No acute fractures. The suspected bony metastasis within the spine and thoracic cage seen on prior CT are less apparent by x-ray. IMPRESSION: 1. Emphysema, with patchy bibasilar consolidation favoring atelectasis. 2. Stable left breast mass as seen on prior CT. Electronically Signed   By: Sharlet Salina M.D.   On: 07/28/2023 20:17   CT Head Wo Contrast  Result Date: 08/13/2023 CLINICAL DATA:  Altered mental status, GCS 10, evaluating intracranial pathology EXAM: CT HEAD WITHOUT CONTRAST TECHNIQUE: Contiguous axial images were obtained from the base of the skull through the vertex without intravenous contrast. RADIATION DOSE REDUCTION: This exam was performed according to the departmental dose-optimization program which includes automated exposure control, adjustment of the mA and/or kV according to patient size  and/or use of iterative reconstruction technique. COMPARISON:  None Available. FINDINGS: Brain: No hemorrhage. No hydrocephalus. No extra-axial fluid collection. No CT evidence of an acute cortical infarct. No mass effect. No mass lesion. With a background of mild chronic microvascular ischemic change. Vascular: No hyperdense vessel or unexpected calcification. Skull: Normal. Negative for fracture or focal lesion. Sinuses/Orbits: No middle ear or mastoid effusion. Paranasal sinuses are clear. Orbits are unremarkable. Other: None. IMPRESSION: No acute intracranial abnormality. Electronically Signed   By: Lorenza Cambridge M.D.   On: 08/25/2023 19:10   CT T-SPINE NO CHARGE  Result Date: 07/23/2023 CLINICAL DATA:  Mid  back pain, pain to left lateral chest wall EXAM: CT THORACIC AND LUMBAR SPINE WITHOUT CONTRAST TECHNIQUE: Multidetector CT imaging of the thoracic and lumbar spine was performed without contrast. Multiplanar CT image reconstructions were also generated. RADIATION DOSE REDUCTION: This exam was performed according to the departmental dose-optimization program which includes automated exposure control, adjustment of the mA and/or kV according to patient size and/or use of iterative reconstruction technique. COMPARISON:  None Available. FINDINGS: CT THORACIC SPINE FINDINGS Alignment: No listhesis. Preservation of the normal thoracic kyphosis. Levocurvature of the upper to midthoracic spine and dextrocurvature of the thoracolumbar junction. Vertebrae: Lytic lesions are suspected in the right aspect of T3 (series 3, image 22), the right aspect of T8 and T9 (series 3, image 67 and 79), and the left aspects of T11 and T12 (series 3, images 98 and 112); these lesions are quite subtle on CT, with the exception of T12 lesion. Vertebral body heights are preserved. No evidence of acute fracture. Paraspinal and other soft tissues: Please see same-day CT chest. Disc levels: Mild degenerative changes, without  significant spinal canal stenosis. CT LUMBAR SPINE FINDINGS Segmentation: 5 lumbar type vertebral bodies. Alignment: Mild levocurvature.  No significant listhesis. Vertebrae: Lytic lesion in the left aspect of L1, with expansile soft tissue component (series 6, image 18). There is likely epidural extension along the right aspect of the tumor (series 6, image 17). No other suspected lesions in the lumbar spine. Vertebral body heights are preserved. No acute fracture. Paraspinal and other soft tissues: Please see same-day CT abdomen pelvis. Disc levels: Disc heights are preserved. No significant spinal canal stenosis or neural foraminal narrowing. IMPRESSION: 1. Lytic lesion in the left aspect of L1, with expansile soft tissue component and likely epidural extension along the right aspect of the tumor. Additional lytic lesions are suspected in T3, T8, T9, T11, and T12. MRI of the thoracic and lumbar spine with and without contrast is recommended for further evaluation. 2. No acute fracture or traumatic listhesis in the thoracic or lumbar spine. 3. For soft tissue findings, please see same-day CT chest abdomen pelvis. Electronically Signed   By: Wiliam Ke M.D.   On: 07/23/2023 12:53   CT L-SPINE NO CHARGE  Result Date: 07/23/2023 CLINICAL DATA:  Mid back pain, pain to left lateral chest wall EXAM: CT THORACIC AND LUMBAR SPINE WITHOUT CONTRAST TECHNIQUE: Multidetector CT imaging of the thoracic and lumbar spine was performed without contrast. Multiplanar CT image reconstructions were also generated. RADIATION DOSE REDUCTION: This exam was performed according to the departmental dose-optimization program which includes automated exposure control, adjustment of the mA and/or kV according to patient size and/or use of iterative reconstruction technique. COMPARISON:  None Available. FINDINGS: CT THORACIC SPINE FINDINGS Alignment: No listhesis. Preservation of the normal thoracic kyphosis. Levocurvature of the upper  to midthoracic spine and dextrocurvature of the thoracolumbar junction. Vertebrae: Lytic lesions are suspected in the right aspect of T3 (series 3, image 22), the right aspect of T8 and T9 (series 3, image 67 and 79), and the left aspects of T11 and T12 (series 3, images 98 and 112); these lesions are quite subtle on CT, with the exception of T12 lesion. Vertebral body heights are preserved. No evidence of acute fracture. Paraspinal and other soft tissues: Please see same-day CT chest. Disc levels: Mild degenerative changes, without significant spinal canal stenosis. CT LUMBAR SPINE FINDINGS Segmentation: 5 lumbar type vertebral bodies. Alignment: Mild levocurvature.  No significant listhesis. Vertebrae: Lytic lesion in the left aspect  of L1, with expansile soft tissue component (series 6, image 18). There is likely epidural extension along the right aspect of the tumor (series 6, image 17). No other suspected lesions in the lumbar spine. Vertebral body heights are preserved. No acute fracture. Paraspinal and other soft tissues: Please see same-day CT abdomen pelvis. Disc levels: Disc heights are preserved. No significant spinal canal stenosis or neural foraminal narrowing. IMPRESSION: 1. Lytic lesion in the left aspect of L1, with expansile soft tissue component and likely epidural extension along the right aspect of the tumor. Additional lytic lesions are suspected in T3, T8, T9, T11, and T12. MRI of the thoracic and lumbar spine with and without contrast is recommended for further evaluation. 2. No acute fracture or traumatic listhesis in the thoracic or lumbar spine. 3. For soft tissue findings, please see same-day CT chest abdomen pelvis. Electronically Signed   By: Wiliam Ke M.D.   On: 07/23/2023 12:53   CT Angio Chest PE W/Cm &/Or Wo Cm  Result Date: 07/23/2023 CLINICAL DATA:  Mid back pain that began 2 weeks ago. * Tracking Code: BO * EXAM: CT ANGIOGRAPHY CHEST CT ABDOMEN AND PELVIS WITH CONTRAST  TECHNIQUE: Multidetector CT imaging of the chest was performed using the standard protocol during bolus administration of intravenous contrast. Multiplanar CT image reconstructions and MIPs were obtained to evaluate the vascular anatomy. Multidetector CT imaging of the abdomen and pelvis was performed using the standard protocol during bolus administration of intravenous contrast. RADIATION DOSE REDUCTION: This exam was performed according to the departmental dose-optimization program which includes automated exposure control, adjustment of the mA and/or kV according to patient size and/or use of iterative reconstruction technique. CONTRAST:  OMNIPAQUE IOHEXOL 350 MG/ML SOLN COMPARISON:  None Available. FINDINGS: CTA CHEST FINDINGS Cardiovascular: Heart is slightly enlarged. No significant pericardial effusion. Coronary artery calcifications are seen. The thoracic aorta has a normal course and caliber with some mild atherosclerotic plaque. There is some enlargement of the main pulmonary arteries. Please correlate for pulmonary artery hypertension. No segmental or larger pulmonary embolism identified. There is some breathing motion which can limit evaluation of small and peripheral emboli. Mediastinum/Nodes: Normal caliber thoracic esophagus. Preserved thyroid gland. There is no specific abnormal lymph node enlargement identified in the right axillary region. There are some small bilateral hilar nodes. Example left left hilum on series 2, image 63 measures 7 mm in short axis. Right hilar node on series 2, image 55 has short axis measurement of 8 mm. There is 1 truly enlarged left hilar node on series 2, image 58 measuring 2.2 1.5 cm. There are some small mediastinal nodes as well. Example subcarinal node has short axis on series 2, image 58 measuring 13 mm. There is fullness in the AP window with a thickness approaching 10 mm on series 2, image 43. Small nodes prevascular. There is also an abnormal left  internal mammary chain node measuring 10 x 9 mm on series 2, image 36. There is extensive abnormal tissue, presumed nodes in the left axillary region with a left-sided large breast mass on series 2, image 75, asymmetric from the right side. Please correlate with clinical history. There is associated skin thickening and anasarca. Lungs/Pleura: No consolidation, pneumothorax. Trace pleural fluid. Basilar areas of bandlike opacity with ground-glass. Underlying centrilobular emphysematous changes diffusely. There is also presence of a left upper lobe nodule on series 4 image 48 measuring 10 by 9 mm. Additional small nodule left lower lobe on series 4, image 98. small  amount of presumed debris along the trachea. Musculoskeletal: Please see separate dictation of spine CT examinations. There are some lytic lesions identified such as the right eighth rib on series 4, image 112. Few other areas suggested as well. Review of the MIP images confirms the above findings. CT ABDOMEN and PELVIS FINDINGS Hepatobiliary: There are numerous small low-attenuation lesions scattered throughout the liver worrisome for metastatic disease. There is a large geographic areas of low-density in the right hepatic lobe consistent with fatty infiltration. In this area there appears to be areas of portal vein branch thrombosis. Changes of potential pseudo cirrhosis. Gallbladder is nondilated. Pancreas: Unremarkable. No pancreatic ductal dilatation or surrounding inflammatory changes. Spleen: Normal in size without focal abnormality. Adrenals/Urinary Tract: Adrenal glands are preserved. No enhancing renal mass or collecting system dilatation. Preserved contours of the urinary bladder. Stomach/Bowel: No oral contrast. The stomach is decompressed. The wall thickening along the pylorus could be related to level of distention. The small bowel is nondilated. Large bowel has a normal course and caliber with scattered stool and diverticulosis. Normal  appendix in the right lower quadrant inferior to the cecum. Vascular/Lymphatic: Scattered vascular calcifications. Normal caliber aorta and IVC. There are several abnormal nodes identified in the upper abdomen. Example portacaval series 3, image 27 measures 2.3 1.4 cm. Gastrohepatic ligament series 3, image 22 measures 17 by 15 mm. Several in the porta hepatis, some Peri aortic nodes as well. Reproductive: Multiple calcified uterine fibroids. No separate adnexal mass. There is some nodular tissue in the area of the perineum on the left side along the vulva such as best seen coronal series 6, image 49 measuring 2.1 cm. Please correlate with direct visualization. Other: Diffuse ascites.  Anasarca. Musculoskeletal: There is some faint lytic lesions along the spine and pelvis. Please see separate spinal imaging. Spine lesions include L1. Trace anterolisthesis of L5 on S1. Review of the MIP images confirms the above findings. Critical Value/emergent results were called by telephone at the time of interpretation on 07/23/2023 at 9:26 am to provider [facility] , who verbally acknowledged these results. IMPRESSION: Large infiltrative left-sided breast mass with skin thickening and edema. Extensive soft tissue in the axilla which could represent abnormal nodes and direct spread. In addition there are several abnormal lymph nodes in the chest and abdomen including lung hilum, mediastinum, internal mammary chain, supraclavicular, retroperitoneum, gastrohepatic ligament, porta hepatis. Liver has a extensive low-attenuation lesions consistent with metastatic disease. In addition there is large geographic areas of fatty infiltration involving multiple segments with what appears to be areas of portal vein thrombosis in these locations. 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 Few lytic bone lesions identified along the spine and ribs. Separate subtle 2 cm proximally mass along the left side  of the vulva. Please correlate with direct visualization. Colonic diverticulosis.  No bowel obstruction or free air. Trace pleural fluid. No segmental or larger pulmonary embolism. Electronically Signed   By: Karen Kays M.D.   On: 07/23/2023 12:29   CT ABDOMEN PELVIS W CONTRAST  Result Date: 07/23/2023 CLINICAL DATA:  Mid back pain that began 2 weeks ago. * Tracking Code: BO * EXAM: CT ANGIOGRAPHY CHEST CT ABDOMEN AND PELVIS WITH CONTRAST TECHNIQUE: Multidetector CT imaging of the chest was performed using the standard protocol during bolus administration of intravenous contrast. Multiplanar CT image reconstructions and MIPs were obtained to evaluate the vascular anatomy. Multidetector CT imaging of the abdomen and pelvis was performed using the standard protocol during bolus  administration of intravenous contrast. RADIATION DOSE REDUCTION: This exam was performed according to the departmental dose-optimization program which includes automated exposure control, adjustment of the mA and/or kV according to patient size and/or use of iterative reconstruction technique. CONTRAST:  OMNIPAQUE IOHEXOL 350 MG/ML SOLN COMPARISON:  None Available. FINDINGS: CTA CHEST FINDINGS Cardiovascular: Heart is slightly enlarged. No significant pericardial effusion. Coronary artery calcifications are seen. The thoracic aorta has a normal course and caliber with some mild atherosclerotic plaque. There is some enlargement of the main pulmonary arteries. Please correlate for pulmonary artery hypertension. No segmental or larger pulmonary embolism identified. There is some breathing motion which can limit evaluation of small and peripheral emboli. Mediastinum/Nodes: Normal caliber thoracic esophagus. Preserved thyroid gland. There is no specific abnormal lymph node enlargement identified in the right axillary region. There are some small bilateral hilar nodes. Example left left hilum on series 2, image 63 measures 7 mm in short  axis. Right hilar node on series 2, image 55 has short axis measurement of 8 mm. There is 1 truly enlarged left hilar node on series 2, image 58 measuring 2.2 1.5 cm. There are some small mediastinal nodes as well. Example subcarinal node has short axis on series 2, image 58 measuring 13 mm. There is fullness in the AP window with a thickness approaching 10 mm on series 2, image 43. Small nodes prevascular. There is also an abnormal left internal mammary chain node measuring 10 x 9 mm on series 2, image 36. There is extensive abnormal tissue, presumed nodes in the left axillary region with a left-sided large breast mass on series 2, image 75, asymmetric from the right side. Please correlate with clinical history. There is associated skin thickening and anasarca. Lungs/Pleura: No consolidation, pneumothorax. Trace pleural fluid. Basilar areas of bandlike opacity with ground-glass. Underlying centrilobular emphysematous changes diffusely. There is also presence of a left upper lobe nodule on series 4 image 48 measuring 10 by 9 mm. Additional small nodule left lower lobe on series 4, image 98. small amount of presumed debris along the trachea. Musculoskeletal: Please see separate dictation of spine CT examinations. There are some lytic lesions identified such as the right eighth rib on series 4, image 112. Few other areas suggested as well. Review of the MIP images confirms the above findings. CT ABDOMEN and PELVIS FINDINGS Hepatobiliary: There are numerous small low-attenuation lesions scattered throughout the liver worrisome for metastatic disease. There is a large geographic areas of low-density in the right hepatic lobe consistent with fatty infiltration. In this area there appears to be areas of portal vein branch thrombosis. Changes of potential pseudo cirrhosis. Gallbladder is nondilated. Pancreas: Unremarkable. No pancreatic ductal dilatation or surrounding inflammatory changes. Spleen: Normal in size without  focal abnormality. Adrenals/Urinary Tract: Adrenal glands are preserved. No enhancing renal mass or collecting system dilatation. Preserved contours of the urinary bladder. Stomach/Bowel: No oral contrast. The stomach is decompressed. The wall thickening along the pylorus could be related to level of distention. The small bowel is nondilated. Large bowel has a normal course and caliber with scattered stool and diverticulosis. Normal appendix in the right lower quadrant inferior to the cecum. Vascular/Lymphatic: Scattered vascular calcifications. Normal caliber aorta and IVC. There are several abnormal nodes identified in the upper abdomen. Example portacaval series 3, image 27 measures 2.3 1.4 cm. Gastrohepatic ligament series 3, image 22 measures 17 by 15 mm. Several in the porta hepatis, some Peri aortic nodes as well. Reproductive: Multiple calcified uterine fibroids. No  separate adnexal mass. There is some nodular tissue in the area of the perineum on the left side along the vulva such as best seen coronal series 6, image 49 measuring 2.1 cm. Please correlate with direct visualization. Other: Diffuse ascites.  Anasarca. Musculoskeletal: There is some faint lytic lesions along the spine and pelvis. Please see separate spinal imaging. Spine lesions include L1. Trace anterolisthesis of L5 on S1. Review of the MIP images confirms the above findings. Critical Value/emergent results were called by telephone at the time of interpretation on 07/23/2023 at 9:26 am to provider [facility] , who verbally acknowledged these results. IMPRESSION: Large infiltrative left-sided breast mass with skin thickening and edema. Extensive soft tissue in the axilla which could represent abnormal nodes and direct spread. In addition there are several abnormal lymph nodes in the chest and abdomen including lung hilum, mediastinum, internal mammary chain, supraclavicular, retroperitoneum, gastrohepatic ligament, porta hepatis. Liver has  a extensive low-attenuation lesions consistent with metastatic disease. In addition there is large geographic areas of fatty infiltration involving multiple segments with what appears to be areas of portal vein thrombosis in these locations. 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 Few lytic bone lesions identified along the spine and ribs. Separate subtle 2 cm proximally mass along the left side of the vulva. Please correlate with direct visualization. Colonic diverticulosis.  No bowel obstruction or free air. Trace pleural fluid. No segmental or larger pulmonary embolism. Electronically Signed   By: Karen Kays M.D.   On: 07/23/2023 12:29    PERFORMANCE STATUS (ECOG) : 1 - Symptomatic but completely ambulatory  Review of Systems Unable to complete  Physical Exam General: Critically ill-appearing Pulmonary: Unlabored, on HFNC Extremities: no edema, no joint deformities Skin: no rashes Neurological: Unresponsive  IMPRESSION: Patient recently hospitalized and found to have a widespread metastatic process involving the bone, liver, and breast.  Plan was for outpatient workup.  Patient now readmitted with severe sepsis after being found unresponsive at home on a wellness check.  Patient currently in ICU.  She is critically ill-appearing and hypoxic.  She is being placed on high flow nasal cannula.  During her previous hospitalization, patient understood that she had an advanced stage cancer, which would be inherently incurable.  However, patient had agreed to pursue outpatient workup and was interested in treatment.  Patient's most immediate goal was to continue working for as long as possible.  She lived at home and was independent with her own care.  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  Merry Proud was able to get [place] phone numbers for patient's cousin (June Hillyard  [phone]) and niece Ace Gins [phone]).  I was unable to reach her niece by phone but did speak with her cousin, June Hayworth.  Together, we discussed the critical nature of patient's current illness.  June verbalized understanding that patient may not survive this hospitalization.  She requested that everything be done short of resuscitation and intubation in accordance with patient's wishes.  However, we also discussed the possible transition to comfort care in the event of further decline.  June says that she is trying to arrange transportation to get to the hospital to see patient today.  PLAN: -Continue current scope of treatment -DNR/DNI -Will follow  Case and plan discussed with Dr. Dareen Piano and Dr. Belia Heman   Time Total: 60 minutes  Visit consisted of counseling and  education dealing with the complex and emotionally intense issues of symptom management and palliative care in the setting of serious and potentially life-threatening illness.Greater than 50%  of this time was spent counseling and coordinating care related to the above assessment and plan.  Signed by: Laurette Schimke, PhD, NP-C

## 2023-08-01 NOTE — Plan of Care (Signed)
  Problem: Pain Management: Goal: General experience of comfort will improve Outcome: Progressing   Problem: Education: Goal: Knowledge of General Education information will improve Description: Including pain rating scale, medication(s)/side effects and non-pharmacologic comfort measures Outcome: Not Progressing   Problem: Health Behavior/Discharge Planning: Goal: Ability to manage health-related needs will improve Outcome: Not Progressing   Problem: Clinical Measurements: Goal: Ability to maintain clinical measurements within normal limits will improve Outcome: Not Progressing Goal: Will remain free from infection Outcome: Not Progressing Goal: Respiratory complications will improve Outcome: Not Progressing   Problem: Activity: Goal: Risk for activity intolerance will decrease Outcome: Not Progressing   Problem: Nutrition: Goal: Adequate nutrition will be maintained Outcome: Not Progressing

## 2023-08-01 NOTE — TOC Initial Note (Signed)
Transition of Care [facility]) - Initial/Assessment Note    Patient Details  Name: Carrie Jones MRN: [id number] Date of Birth: Jan 28, 1961  [facility]) CM/SW Contact:    Colette Ribas, LCSWA Phone Number: 08/01/2023, 9:04 AM  Clinical Narrative:                  CSW received a consult for SA resources. Patient has medical history significant of alcohol and tobacco use. CSW added SA resources to AVS.       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) [C79.81] Severe sepsis (HCC) [A41.9, R65.20] Sepsis, due to unspecified organism, unspecified whether acute organ dysfunction present [facility]) [A41.9] Patient Active Problem List   Diagnosis Date Noted   Abnormal LFTs 08/25/2023   Acute metabolic encephalopathy 08/05/2023   UTI (urinary tract infection) 08/18/2023   Dehydration 08/22/2023   Hypercalcemia 08/09/2023   Hypoglycemia 08/19/2023   Thrombocytopenia (HCC) 08/04/2023   Rhabdomyolysis 07/28/2023   AKI (acute kidney injury) (HCC) 08/25/2023   Severe sepsis with septic shock (HCC) 08/04/2023   Palliative care encounter 07/24/2023   Metastatic malignant neoplasm (HCC) 07/24/2023   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 [place] [address] Phone:  [phone] Fax: [phone]     Social Determinants of Health (SDOH) Social History: SDOH Screenings   Food Insecurity: No Food Insecurity (07/23/2023)  Housing: Low Risk  (07/23/2023)  Transportation Needs: No Transportation Needs (07/23/2023)  Utilities: Not At Risk (07/23/2023)  Tobacco Use: High Risk (07/23/2023)   SDOH Interventions:     Readmission Risk Interventions     No data to display

## 2023-08-01 NOTE — Progress Notes (Signed)
Hypoglycemic Event  CBG: 64  Treatment: D50 25 mL (12.5 gm)  Symptoms: None  Follow-up CBG: Time:0850 CBG Result:121  Possible Reasons for Event: Unknown  Comments/MD notified: Jamelle Rushing MD notified at [phone] [facility]

## 2023-08-02 LAB — URINE CULTURE: Culture: >=100000 — AB

## 2023-08-02 MED ORDER — MORPHINE SULFATE (PF) 2 MG/ML IV SOLN
2.0000 mg | INTRAVENOUS | Status: DC | PRN
  Administered 2023-08-02: 2 mg via INTRAVENOUS
  Filled 2023-08-02: qty 1

## 2023-08-05 LAB — CULTURE, BLOOD (ROUTINE X 2)
Culture: NO GROWTH
Culture: NO GROWTH

## 2023-08-26 NOTE — Progress Notes (Signed)
   08/14/2023 0300  Spiritual Encounters  Type of Visit Initial  Care provided to: Pt and family  Referral source Nurse (RN/NT/LPN);Patient request  Reason for visit Routine spiritual support  OnCall Visit Yes  Interventions  Spiritual Care Interventions Made Established relationship of care and support;Compassionate presence;Reflective listening;Normalization of emotions;Prayer;Encouragement  Intervention Outcomes  Outcomes Awareness of support;Connection to spiritual care  Spiritual Care Plan  Spiritual Care Issues Still Outstanding No further spiritual care needs at this time (see row info)   Chaplain spiritual support services remain available as the need arises.

## 2023-08-26 NOTE — Death Summary Note (Signed)
DEATH SUMMARY   Patient Details  Name: Carrie Jones MRN: [id number] DOB: [birth year] KGM:[id number], No Pcp Per Admission/Discharge Information   Admit Date:  08/08/23  Date of Death: Date of Death: 08-10-23  Time of Death: Time of Death: 0228  Length of Stay: 2   Principle Cause of death: sepsis, respiratory failure  Hospital Diagnoses: Principal Problem:   Severe sepsis with septic shock (HCC) Active Problems:   UTI (urinary tract infection)   Acute metabolic encephalopathy   Rhabdomyolysis   Portal vein thrombosis   Abnormal LFTs   Hypercalcemia   Hypoglycemia   Thrombocytopenia (HCC)   AKI (acute kidney injury) (HCC)   Alcohol abuse   Tobacco abuse   Breast cancer metastasized to bone [facility])   Vulvar mass   Hospital Course: Carrie Jones is a 62 y.o. 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   Patient presented with unresponsive altered mental status, cannot provide any medical history. All information collected from chart review and report of first responders. wellbeing check was performed today and patient was found to be unresponsive in the bed. Pt was hypotensive and tachycardic with EMS concerning for sepsis.   Pt was recently hospitalized from 10/28 - 10/31 due to breast cancer with significant metastatic disease to bone and liver.  Dr. Orlie Dakin of oncology is consulted. Pt also has portal vein thrombosis, and was discharged on Eliquis.    ED Course: WBC 15.0, thrombocytopenia with platelet 89 (platelet was 147 on 07/24/2023), positive urinalysis (cloudy appearance, negative leukocyte, many bacteria, WBC 11-20), negative PCR for COVID, flu and RSV, CKD 1131, calcium 10.9, AKI with creatinine 1.33, BUN 66, GFR 45 (recent baseline creatinine 0.53 on 07/23/2023), abnormal liver function (ALP 172, AST 488, ALT 62, total protein 11.9),  hypoglycemia with blood sugar 58, which improved to 178 after giving D D50, lactic acid 5.8 --> 7.0.  CT of head is negative for acute intracranial abnormalities. Chest x-ray: 1. Emphysema, with patchy bibasilar consolidation favoring  atelectasis. 2. Stable left breast mass as seen on prior CT. CT of abdomen/pelvis: 1. No acute findings. 2. Left breast mass with overlying skin thickening, incompletely visualized. 3. Hepatomegaly with innumerable small low-attenuation lesions throughout the liver, largest 1.8 cm in segment 7, consistent with metastatic disease. 4. Left para-aortic retroperitoneal adenopathy, stable. 5. Multiple lytic and permeative osseous lesions, consistent with metastatic disease. 6. Small volume abdominal and pelvic ascites, stable. 7. Scattered mostly dependent airspace opacities in the lung bases right greater than left. 8.  Aortic Atherosclerosis (ICD10-I70.0).   EKG: Sinus rhythm, QTc 445, LAD, low voltage, early R wave progression.   They were initially treated with empiric Abx, IV fluid boluses, steroids. Despite maximum intravascular support and oxygen support, she did not achieve hemodynamically stability and remained unresponsive. Palliative was consulted and her family was called and came to bedside.  They transitioned her to comfort care overnight.  She passed away shortly after.   Procedures: none   Consultations: PCCM, palliative   The results of significant diagnostics from this hospitalization (including imaging, microbiology, ancillary and laboratory) are listed below for reference.   Significant Diagnostic Studies: CT ABDOMEN PELVIS W CONTRAST  Result Date: 08-08-23 CLINICAL DATA:  Altered mental status, distended abdomen EXAM: CT ABDOMEN AND PELVIS WITH CONTRAST TECHNIQUE: Multidetector CT imaging of the abdomen and pelvis was performed using the standard protocol following bolus administration of intravenous contrast. RADIATION  DOSE  REDUCTION: This exam was performed according to the departmental dose-optimization program which includes automated exposure control, adjustment of the mA and/or kV according to patient size and/or use of iterative reconstruction technique. CONTRAST:  75mL OMNIPAQUE IOHEXOL 300 MG/ML  SOLN COMPARISON:  07/23/2023 FINDINGS: Lower chest: No pleural or pericardial effusion. Scattered mostly dependent airspace opacities in the lung bases right greater than left. Left breast mass with overlying skin thickening, incompletely visualized. Hepatobiliary: Hepatomegaly with innumerable small low-attenuation lesions throughout the liver, largest 1.8 cm in segment 7. Geographic region of low attenuation in the right and medial left lobes suggesting geographic fatty infiltration, progressive on the right since previous exam. Vicarious excretion of contrast into the lumen of the nondilated gallbladder. No definite biliary ductal dilatation. Pancreas: Unremarkable. No pancreatic ductal dilatation or surrounding inflammatory changes. Spleen: Normal in size without focal abnormality. Adrenals/Urinary Tract: No adrenal mass. Symmetric renal parenchymal enhancement without focal lesion or hydronephrosis. Urinary bladder partially distended. Stomach/Bowel: Stomach is decompressed, without acute finding. Small bowel nondistended. Appendix not localized. Colon incompletely distended with scattered diverticula. Vascular/Lymphatic: Mild scattered aortoiliac calcified atheromatous plaque without AAA. Left para-aortic retroperitoneal adenopathy up to 1.2 cm short axis diameter as before. Reproductive: Calcified uterine fibroids. Prominent fluid attenuation within the endometrium. No adnexal mass. Other: Small volume abdominal and pelvic ascites stable. No free air. Musculoskeletal: Facet DJD L5-S1 allowing grade 1 anterolisthesis. Lucent lesion in the L1 vertebral body involving the left pedicle. Lucent lesion involving the right superior  articular process of L2. Lytic lesion involving the first sacral segment. Lytic lesions involving spinous process of T10. Permeative lesions involving right ilium, and left iliac wing. No definite pathologic fracture. IMPRESSION: 1. No acute findings. 2. Left breast mass with overlying skin thickening, incompletely visualized. 3. Hepatomegaly with innumerable small low-attenuation lesions throughout the liver, largest 1.8 cm in segment 7, consistent with metastatic disease. 4. Left para-aortic retroperitoneal adenopathy, stable. 5. Multiple lytic and permeative osseous lesions, consistent with metastatic disease. 6. Small volume abdominal and pelvic ascites, stable. 7. Scattered mostly dependent airspace opacities in the lung bases right greater than left. 8.  Aortic Atherosclerosis (ICD10-I70.0). Electronically Signed   By: Corlis Leak M.D.   On: 08/18/2023 21:07   DG Chest Port 1 View  Result Date: 08/21/2023 CLINICAL DATA:  Sepsis, found unresponsive EXAM: PORTABLE CHEST 1 VIEW COMPARISON:  None Available. FINDINGS: Single frontal view of the chest demonstrates an unremarkable cardiac silhouette. There is diffuse interstitial prominence throughout the lungs, with patchy bibasilar consolidation favoring atelectasis. No effusion or pneumothorax. Increased soft tissue density overlying the left chest consistent with left breast mass identified on prior CT. No acute fractures. The suspected bony metastasis within the spine and thoracic cage seen on prior CT are less apparent by x-ray. IMPRESSION: 1. Emphysema, with patchy bibasilar consolidation favoring atelectasis. 2. Stable left breast mass as seen on prior CT. Electronically Signed   By: Sharlet Salina M.D.   On: 08/13/2023 20:17   CT Head Wo Contrast  Result Date: 08/09/2023 CLINICAL DATA:  Altered mental status, GCS 10, evaluating intracranial pathology EXAM: CT HEAD WITHOUT CONTRAST TECHNIQUE: Contiguous axial images were obtained from the base of the  skull through the vertex without intravenous contrast. RADIATION DOSE REDUCTION: This exam was performed according to the departmental dose-optimization program which includes automated exposure control, adjustment of the mA and/or kV according to patient size and/or use of iterative reconstruction technique. COMPARISON:  None Available. FINDINGS: Brain: No hemorrhage. No hydrocephalus. No extra-axial  fluid collection. No CT evidence of an acute cortical infarct. No mass effect. No mass lesion. With a background of mild chronic microvascular ischemic change. Vascular: No hyperdense vessel or unexpected calcification. Skull: Normal. Negative for fracture or focal lesion. Sinuses/Orbits: No middle ear or mastoid effusion. Paranasal sinuses are clear. Orbits are unremarkable. Other: None. IMPRESSION: No acute intracranial abnormality. Electronically Signed   By: Lorenza Cambridge M.D.   On: 08/16/2023 19:10   CT T-SPINE NO CHARGE  Result Date: 07/23/2023 CLINICAL DATA:  Mid back pain, pain to left lateral chest wall EXAM: CT THORACIC AND LUMBAR SPINE WITHOUT CONTRAST TECHNIQUE: Multidetector CT imaging of the thoracic and lumbar spine was performed without contrast. Multiplanar CT image reconstructions were also generated. RADIATION DOSE REDUCTION: This exam was performed according to the departmental dose-optimization program which includes automated exposure control, adjustment of the mA and/or kV according to patient size and/or use of iterative reconstruction technique. COMPARISON:  None Available. FINDINGS: CT THORACIC SPINE FINDINGS Alignment: No listhesis. Preservation of the normal thoracic kyphosis. Levocurvature of the upper to midthoracic spine and dextrocurvature of the thoracolumbar junction. Vertebrae: Lytic lesions are suspected in the right aspect of T3 (series 3, image 22), the right aspect of T8 and T9 (series 3, image 67 and 79), and the left aspects of T11 and T12 (series 3, images 98 and 112);  these lesions are quite subtle on CT, with the exception of T12 lesion. Vertebral body heights are preserved. No evidence of acute fracture. Paraspinal and other soft tissues: Please see same-day CT chest. Disc levels: Mild degenerative changes, without significant spinal canal stenosis. CT LUMBAR SPINE FINDINGS Segmentation: 5 lumbar type vertebral bodies. Alignment: Mild levocurvature.  No significant listhesis. Vertebrae: Lytic lesion in the left aspect of L1, with expansile soft tissue component (series 6, image 18). There is likely epidural extension along the right aspect of the tumor (series 6, image 17). No other suspected lesions in the lumbar spine. Vertebral body heights are preserved. No acute fracture. Paraspinal and other soft tissues: Please see same-day CT abdomen pelvis. Disc levels: Disc heights are preserved. No significant spinal canal stenosis or neural foraminal narrowing. IMPRESSION: 1. Lytic lesion in the left aspect of L1, with expansile soft tissue component and likely epidural extension along the right aspect of the tumor. Additional lytic lesions are suspected in T3, T8, T9, T11, and T12. MRI of the thoracic and lumbar spine with and without contrast is recommended for further evaluation. 2. No acute fracture or traumatic listhesis in the thoracic or lumbar spine. 3. For soft tissue findings, please see same-day CT chest abdomen pelvis. Electronically Signed   By: Wiliam Ke M.D.   On: 07/23/2023 12:53   CT L-SPINE NO CHARGE  Result Date: 07/23/2023 CLINICAL DATA:  Mid back pain, pain to left lateral chest wall EXAM: CT THORACIC AND LUMBAR SPINE WITHOUT CONTRAST TECHNIQUE: Multidetector CT imaging of the thoracic and lumbar spine was performed without contrast. Multiplanar CT image reconstructions were also generated. RADIATION DOSE REDUCTION: This exam was performed according to the departmental dose-optimization program which includes automated exposure control, adjustment of  the mA and/or kV according to patient size and/or use of iterative reconstruction technique. COMPARISON:  None Available. FINDINGS: CT THORACIC SPINE FINDINGS Alignment: No listhesis. Preservation of the normal thoracic kyphosis. Levocurvature of the upper to midthoracic spine and dextrocurvature of the thoracolumbar junction. Vertebrae: Lytic lesions are suspected in the right aspect of T3 (series 3, image 22), the right aspect of T8  and T9 (series 3, image 67 and 79), and the left aspects of T11 and T12 (series 3, images 98 and 112); these lesions are quite subtle on CT, with the exception of T12 lesion. Vertebral body heights are preserved. No evidence of acute fracture. Paraspinal and other soft tissues: Please see same-day CT chest. Disc levels: Mild degenerative changes, without significant spinal canal stenosis. CT LUMBAR SPINE FINDINGS Segmentation: 5 lumbar type vertebral bodies. Alignment: Mild levocurvature.  No significant listhesis. Vertebrae: Lytic lesion in the left aspect of L1, with expansile soft tissue component (series 6, image 18). There is likely epidural extension along the right aspect of the tumor (series 6, image 17). No other suspected lesions in the lumbar spine. Vertebral body heights are preserved. No acute fracture. Paraspinal and other soft tissues: Please see same-day CT abdomen pelvis. Disc levels: Disc heights are preserved. No significant spinal canal stenosis or neural foraminal narrowing. IMPRESSION: 1. Lytic lesion in the left aspect of L1, with expansile soft tissue component and likely epidural extension along the right aspect of the tumor. Additional lytic lesions are suspected in T3, T8, T9, T11, and T12. MRI of the thoracic and lumbar spine with and without contrast is recommended for further evaluation. 2. No acute fracture or traumatic listhesis in the thoracic or lumbar spine. 3. For soft tissue findings, please see same-day CT chest abdomen pelvis. Electronically  Signed   By: Wiliam Ke M.D.   On: 07/23/2023 12:53   CT Angio Chest PE W/Cm &/Or Wo Cm  Result Date: 07/23/2023 CLINICAL DATA:  Mid back pain that began 2 weeks ago. * Tracking Code: BO * EXAM: CT ANGIOGRAPHY CHEST CT ABDOMEN AND PELVIS WITH CONTRAST TECHNIQUE: Multidetector CT imaging of the chest was performed using the standard protocol during bolus administration of intravenous contrast. Multiplanar CT image reconstructions and MIPs were obtained to evaluate the vascular anatomy. Multidetector CT imaging of the abdomen and pelvis was performed using the standard protocol during bolus administration of intravenous contrast. RADIATION DOSE REDUCTION: This exam was performed according to the departmental dose-optimization program which includes automated exposure control, adjustment of the mA and/or kV according to patient size and/or use of iterative reconstruction technique. CONTRAST:  OMNIPAQUE IOHEXOL 350 MG/ML SOLN COMPARISON:  None Available. FINDINGS: CTA CHEST FINDINGS Cardiovascular: Heart is slightly enlarged. No significant pericardial effusion. Coronary artery calcifications are seen. The thoracic aorta has a normal course and caliber with some mild atherosclerotic plaque. There is some enlargement of the main pulmonary arteries. Please correlate for pulmonary artery hypertension. No segmental or larger pulmonary embolism identified. There is some breathing motion which can limit evaluation of small and peripheral emboli. Mediastinum/Nodes: Normal caliber thoracic esophagus. Preserved thyroid gland. There is no specific abnormal lymph node enlargement identified in the right axillary region. There are some small bilateral hilar nodes. Example left left hilum on series 2, image 63 measures 7 mm in short axis. Right hilar node on series 2, image 55 has short axis measurement of 8 mm. There is 1 truly enlarged left hilar node on series 2, image 58 measuring 2.2 1.5 cm. There are some small  mediastinal nodes as well. Example subcarinal node has short axis on series 2, image 58 measuring 13 mm. There is fullness in the AP window with a thickness approaching 10 mm on series 2, image 43. Small nodes prevascular. There is also an abnormal left internal mammary chain node measuring 10 x 9 mm on series 2, image 36. There is  extensive abnormal tissue, presumed nodes in the left axillary region with a left-sided large breast mass on series 2, image 75, asymmetric from the right side. Please correlate with clinical history. There is associated skin thickening and anasarca. Lungs/Pleura: No consolidation, pneumothorax. Trace pleural fluid. Basilar areas of bandlike opacity with ground-glass. Underlying centrilobular emphysematous changes diffusely. There is also presence of a left upper lobe nodule on series 4 image 48 measuring 10 by 9 mm. Additional small nodule left lower lobe on series 4, image 98. small amount of presumed debris along the trachea. Musculoskeletal: Please see separate dictation of spine CT examinations. There are some lytic lesions identified such as the right eighth rib on series 4, image 112. Few other areas suggested as well. Review of the MIP images confirms the above findings. CT ABDOMEN and PELVIS FINDINGS Hepatobiliary: There are numerous small low-attenuation lesions scattered throughout the liver worrisome for metastatic disease. There is a large geographic areas of low-density in the right hepatic lobe consistent with fatty infiltration. In this area there appears to be areas of portal vein branch thrombosis. Changes of potential pseudo cirrhosis. Gallbladder is nondilated. Pancreas: Unremarkable. No pancreatic ductal dilatation or surrounding inflammatory changes. Spleen: Normal in size without focal abnormality. Adrenals/Urinary Tract: Adrenal glands are preserved. No enhancing renal mass or collecting system dilatation. Preserved contours of the urinary bladder. Stomach/Bowel:  No oral contrast. The stomach is decompressed. The wall thickening along the pylorus could be related to level of distention. The small bowel is nondilated. Large bowel has a normal course and caliber with scattered stool and diverticulosis. Normal appendix in the right lower quadrant inferior to the cecum. Vascular/Lymphatic: Scattered vascular calcifications. Normal caliber aorta and IVC. There are several abnormal nodes identified in the upper abdomen. Example portacaval series 3, image 27 measures 2.3 1.4 cm. Gastrohepatic ligament series 3, image 22 measures 17 by 15 mm. Several in the porta hepatis, some Peri aortic nodes as well. Reproductive: Multiple calcified uterine fibroids. No separate adnexal mass. There is some nodular tissue in the area of the perineum on the left side along the vulva such as best seen coronal series 6, image 49 measuring 2.1 cm. Please correlate with direct visualization. Other: Diffuse ascites.  Anasarca. Musculoskeletal: There is some faint lytic lesions along the spine and pelvis. Please see separate spinal imaging. Spine lesions include L1. Trace anterolisthesis of L5 on S1. Review of the MIP images confirms the above findings. Critical Value/emergent results were called by telephone at the time of interpretation on 07/23/2023 at 9:26 am to provider [facility] , who verbally acknowledged these results. IMPRESSION: Large infiltrative left-sided breast mass with skin thickening and edema. Extensive soft tissue in the axilla which could represent abnormal nodes and direct spread. In addition there are several abnormal lymph nodes in the chest and abdomen including lung hilum, mediastinum, internal mammary chain, supraclavicular, retroperitoneum, gastrohepatic ligament, porta hepatis. Liver has a extensive low-attenuation lesions consistent with metastatic disease. In addition there is large geographic areas of fatty infiltration involving multiple segments with what appears to  be areas of portal vein thrombosis in these locations. 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 Few lytic bone lesions identified along the spine and ribs. Separate subtle 2 cm proximally mass along the left side of the vulva. Please correlate with direct visualization. Colonic diverticulosis.  No bowel obstruction or free air. Trace pleural fluid. No segmental or larger pulmonary embolism. Electronically Signed   By: Piedad Climes.D.  On: 07/23/2023 12:29   CT ABDOMEN PELVIS W CONTRAST  Result Date: 07/23/2023 CLINICAL DATA:  Mid back pain that began 2 weeks ago. * Tracking Code: BO * EXAM: CT ANGIOGRAPHY CHEST CT ABDOMEN AND PELVIS WITH CONTRAST TECHNIQUE: Multidetector CT imaging of the chest was performed using the standard protocol during bolus administration of intravenous contrast. Multiplanar CT image reconstructions and MIPs were obtained to evaluate the vascular anatomy. Multidetector CT imaging of the abdomen and pelvis was performed using the standard protocol during bolus administration of intravenous contrast. RADIATION DOSE REDUCTION: This exam was performed according to the departmental dose-optimization program which includes automated exposure control, adjustment of the mA and/or kV according to patient size and/or use of iterative reconstruction technique. CONTRAST:  OMNIPAQUE IOHEXOL 350 MG/ML SOLN COMPARISON:  None Available. FINDINGS: CTA CHEST FINDINGS Cardiovascular: Heart is slightly enlarged. No significant pericardial effusion. Coronary artery calcifications are seen. The thoracic aorta has a normal course and caliber with some mild atherosclerotic plaque. There is some enlargement of the main pulmonary arteries. Please correlate for pulmonary artery hypertension. No segmental or larger pulmonary embolism identified. There is some breathing motion which can limit evaluation of small and peripheral emboli. Mediastinum/Nodes: Normal caliber  thoracic esophagus. Preserved thyroid gland. There is no specific abnormal lymph node enlargement identified in the right axillary region. There are some small bilateral hilar nodes. Example left left hilum on series 2, image 63 measures 7 mm in short axis. Right hilar node on series 2, image 55 has short axis measurement of 8 mm. There is 1 truly enlarged left hilar node on series 2, image 58 measuring 2.2 1.5 cm. There are some small mediastinal nodes as well. Example subcarinal node has short axis on series 2, image 58 measuring 13 mm. There is fullness in the AP window with a thickness approaching 10 mm on series 2, image 43. Small nodes prevascular. There is also an abnormal left internal mammary chain node measuring 10 x 9 mm on series 2, image 36. There is extensive abnormal tissue, presumed nodes in the left axillary region with a left-sided large breast mass on series 2, image 75, asymmetric from the right side. Please correlate with clinical history. There is associated skin thickening and anasarca. Lungs/Pleura: No consolidation, pneumothorax. Trace pleural fluid. Basilar areas of bandlike opacity with ground-glass. Underlying centrilobular emphysematous changes diffusely. There is also presence of a left upper lobe nodule on series 4 image 48 measuring 10 by 9 mm. Additional small nodule left lower lobe on series 4, image 98. small amount of presumed debris along the trachea. Musculoskeletal: Please see separate dictation of spine CT examinations. There are some lytic lesions identified such as the right eighth rib on series 4, image 112. Few other areas suggested as well. Review of the MIP images confirms the above findings. CT ABDOMEN and PELVIS FINDINGS Hepatobiliary: There are numerous small low-attenuation lesions scattered throughout the liver worrisome for metastatic disease. There is a large geographic areas of low-density in the right hepatic lobe consistent with fatty infiltration. In this area  there appears to be areas of portal vein branch thrombosis. Changes of potential pseudo cirrhosis. Gallbladder is nondilated. Pancreas: Unremarkable. No pancreatic ductal dilatation or surrounding inflammatory changes. Spleen: Normal in size without focal abnormality. Adrenals/Urinary Tract: Adrenal glands are preserved. No enhancing renal mass or collecting system dilatation. Preserved contours of the urinary bladder. Stomach/Bowel: No oral contrast. The stomach is decompressed. The wall thickening along the pylorus could be related to level  of distention. The small bowel is nondilated. Large bowel has a normal course and caliber with scattered stool and diverticulosis. Normal appendix in the right lower quadrant inferior to the cecum. Vascular/Lymphatic: Scattered vascular calcifications. Normal caliber aorta and IVC. There are several abnormal nodes identified in the upper abdomen. Example portacaval series 3, image 27 measures 2.3 1.4 cm. Gastrohepatic ligament series 3, image 22 measures 17 by 15 mm. Several in the porta hepatis, some Peri aortic nodes as well. Reproductive: Multiple calcified uterine fibroids. No separate adnexal mass. There is some nodular tissue in the area of the perineum on the left side along the vulva such as best seen coronal series 6, image 49 measuring 2.1 cm. Please correlate with direct visualization. Other: Diffuse ascites.  Anasarca. Musculoskeletal: There is some faint lytic lesions along the spine and pelvis. Please see separate spinal imaging. Spine lesions include L1. Trace anterolisthesis of L5 on S1. Review of the MIP images confirms the above findings. Critical Value/emergent results were called by telephone at the time of interpretation on 07/23/2023 at 9:26 am to provider [facility] , who verbally acknowledged these results. IMPRESSION: Large infiltrative left-sided breast mass with skin thickening and edema. Extensive soft tissue in the axilla which could represent  abnormal nodes and direct spread. In addition there are several abnormal lymph nodes in the chest and abdomen including lung hilum, mediastinum, internal mammary chain, supraclavicular, retroperitoneum, gastrohepatic ligament, porta hepatis. Liver has a extensive low-attenuation lesions consistent with metastatic disease. In addition there is large geographic areas of fatty infiltration involving multiple segments with what appears to be areas of portal vein thrombosis in these locations. 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 Few lytic bone lesions identified along the spine and ribs. Separate subtle 2 cm proximally mass along the left side of the vulva. Please correlate with direct visualization. Colonic diverticulosis.  No bowel obstruction or free air. Trace pleural fluid. No segmental or larger pulmonary embolism. Electronically Signed   By: Karen Kays M.D.   On: 07/23/2023 12:29    Microbiology: Recent Results (from the past 240 hour(s))  Resp panel by RT-PCR (RSV, Flu A&B, Covid) Anterior Nasal Swab     Status: None   Collection Time: 07/29/2023  5:27 PM   Specimen: Anterior Nasal Swab  Result Value Ref Range Status   SARS Coronavirus 2 by RT PCR NEGATIVE NEGATIVE Final    Comment: (NOTE) SARS-CoV-2 target nucleic acids are NOT DETECTED.  The SARS-CoV-2 RNA is generally detectable in upper respiratory specimens during the acute phase of infection. The lowest concentration of SARS-CoV-2 viral copies this assay can detect is 138 copies/mL. A negative result does not preclude SARS-Cov-2 infection and should not be used as the sole basis for treatment or other patient management decisions. A negative result may occur with  improper specimen collection/handling, submission of specimen other than nasopharyngeal swab, presence of viral mutation(s) within the areas targeted by this assay, and inadequate number of viral copies(<138 copies/mL). A negative  result must be combined with clinical observations, patient history, and epidemiological information. The expected result is Negative.  Fact Sheet for Patients:  BloggerCourse.com  Fact Sheet for Healthcare Providers:  SeriousBroker.it  This test is no t yet approved or cleared by the [place] FDA and  has been authorized for detection and/or diagnosis of SARS-CoV-2 by FDA under an Emergency Use Authorization (EUA). This EUA will remain  in effect (meaning this test can be used) for the duration of  the COVID-19 declaration under Section 564(b)(1) of the Act, 21 U.S.C.section 360bbb-3(b)(1), unless the authorization is terminated  or revoked sooner.       Influenza A by PCR NEGATIVE NEGATIVE Final   Influenza B by PCR NEGATIVE NEGATIVE Final    Comment: (NOTE) The Xpert Xpress SARS-CoV-2/FLU/RSV plus assay is intended as an aid in the diagnosis of influenza from Nasopharyngeal swab specimens and should not be used as a sole basis for treatment. Nasal washings and aspirates are unacceptable for Xpert Xpress SARS-CoV-2/FLU/RSV testing.  Fact Sheet for Patients: BloggerCourse.com  Fact Sheet for Healthcare Providers: SeriousBroker.it  This test is not yet approved or cleared by the [place] FDA and has been authorized for detection and/or diagnosis of SARS-CoV-2 by FDA under an Emergency Use Authorization (EUA). This EUA will remain in effect (meaning this test can be used) for the duration of the COVID-19 declaration under Section 564(b)(1) of the Act, 21 U.S.C. section 360bbb-3(b)(1), unless the authorization is terminated or revoked.     Resp Syncytial Virus by PCR NEGATIVE NEGATIVE Final    Comment: (NOTE) Fact Sheet for Patients: BloggerCourse.com  Fact Sheet for Healthcare Providers: SeriousBroker.it  This  test is not yet approved or cleared by the [place] FDA and has been authorized for detection and/or diagnosis of SARS-CoV-2 by FDA under an Emergency Use Authorization (EUA). This EUA will remain in effect (meaning this test can be used) for the duration of the COVID-19 declaration under Section 564(b)(1) of the Act, 21 U.S.C. section 360bbb-3(b)(1), unless the authorization is terminated or revoked.  Performed at [facility], [address], [place], [place] [address]   Urine Culture     Status: Abnormal   Collection Time: 08/15/2023  5:27 PM   Specimen: Urine, Catheterized  Result Value Ref Range Status   Specimen Description   Final    URINE, CATHETERIZED Performed at [facility], [address] [place], [place] [address]    Special Requests   Final    NONE Reflexed from [id number] Performed at [facility], [address] [place], [place] [address]    Culture >=100,000 COLONIES/mL KLEBSIELLA PNEUMONIAE (A)  Final   Report Status 08/03/2023 FINAL  Final   Organism ID, Bacteria KLEBSIELLA PNEUMONIAE (A)  Final      Susceptibility   Klebsiella pneumoniae - MIC*    AMPICILLIN RESISTANT Resistant     CEFAZOLIN <=4 SENSITIVE Sensitive     CEFEPIME <=0.12 SENSITIVE Sensitive     CEFTRIAXONE <=0.25 SENSITIVE Sensitive     CIPROFLOXACIN <=0.25 SENSITIVE Sensitive     GENTAMICIN <=1 SENSITIVE Sensitive     IMIPENEM <=0.25 SENSITIVE Sensitive     NITROFURANTOIN 32 SENSITIVE Sensitive     TRIMETH/SULFA <=20 SENSITIVE Sensitive     AMPICILLIN/SULBACTAM 4 SENSITIVE Sensitive     PIP/TAZO <=4 SENSITIVE Sensitive ug/mL    * >=100,000 COLONIES/mL KLEBSIELLA PNEUMONIAE  Blood Culture (routine x 2)     Status: None (Preliminary result)   Collection Time: 08/17/2023  6:07 PM   Specimen: BLOOD  Result Value Ref Range Status   Specimen Description BLOOD BLOOD RIGHT ARM  Final   Special Requests   Final    BOTTLES DRAWN AEROBIC AND ANAEROBIC Blood Culture  results may not be optimal due to an inadequate volume of blood received in culture bottles   Culture   Final    NO GROWTH 2 DAYS Performed at [facility], [address], [place], [place] [address]  Report Status PENDING  Incomplete  Blood Culture (routine x 2)     Status: None (Preliminary result)   Collection Time: 08/10/2023 10:35 PM   Specimen: BLOOD  Result Value Ref Range Status   Specimen Description BLOOD BLOOD LEFT HAND  Final   Special Requests   Final    BOTTLES DRAWN AEROBIC ONLY Blood Culture results may not be optimal due to an inadequate volume of blood received in culture bottles   Culture   Final    NO GROWTH 2 DAYS Performed at [facility], [address], [place], [place] [address]    Report Status PENDING  Incomplete  MRSA Next Gen by PCR, Nasal     Status: None   Collection Time: 08/01/23  2:33 AM   Specimen: Nasal Mucosa; Nasal Swab  Result Value Ref Range Status   MRSA by PCR Next Gen NOT DETECTED NOT DETECTED Final    Comment: (NOTE) The GeneXpert MRSA Assay (FDA approved for NASAL specimens only), is one component of a comprehensive MRSA colonization surveillance program. It is not intended to diagnose MRSA infection nor to guide or monitor treatment for MRSA infections. Test performance is not FDA approved in patients less than 16 years old. Performed at [facility], [address], [place], [place] [address]     Time spent: 75 minutes  Signed: Leeroy Bock, MD 08/12/2023

## 2023-08-26 NOTE — Progress Notes (Signed)
Cellphone, keys, x5 silver colored rings sent to morgue with patient.

## 2023-08-26 NOTE — Progress Notes (Signed)
Attempted to notify Kennyth Arnold, niece, of pt's passing x3. Unable to reach and no VM set up. Provider notified. Post-mortem care completed.

## 2023-08-26 DEATH — deceased
